# Patient Record
Sex: Female | Born: 1965
Health system: Southern US, Community
[De-identification: ages and names within clinical notes are randomized; demographics above are authoritative.]

## PROBLEM LIST (undated history)

## (undated) ENCOUNTER — Ambulatory Visit: Admission: EM | Source: Home / Self Care

## (undated) DIAGNOSIS — R739 Hyperglycemia, unspecified: Secondary | ICD-10-CM

## (undated) DIAGNOSIS — F32A Depression, unspecified: Secondary | ICD-10-CM

## (undated) DIAGNOSIS — D497 Neoplasm of unspecified behavior of endocrine glands and other parts of nervous system: Secondary | ICD-10-CM

## (undated) DIAGNOSIS — K589 Irritable bowel syndrome without diarrhea: Secondary | ICD-10-CM

## (undated) DIAGNOSIS — G2581 Restless legs syndrome: Secondary | ICD-10-CM

## (undated) DIAGNOSIS — R002 Palpitations: Secondary | ICD-10-CM

## (undated) DIAGNOSIS — F329 Major depressive disorder, single episode, unspecified: Secondary | ICD-10-CM

## (undated) DIAGNOSIS — F17200 Nicotine dependence, unspecified, uncomplicated: Secondary | ICD-10-CM

## (undated) DIAGNOSIS — E538 Deficiency of other specified B group vitamins: Secondary | ICD-10-CM

## (undated) DIAGNOSIS — N951 Menopausal and female climacteric states: Secondary | ICD-10-CM

## (undated) DIAGNOSIS — E785 Hyperlipidemia, unspecified: Secondary | ICD-10-CM

## (undated) DIAGNOSIS — F419 Anxiety disorder, unspecified: Secondary | ICD-10-CM

## (undated) DIAGNOSIS — T7840XA Allergy, unspecified, initial encounter: Secondary | ICD-10-CM

## (undated) DIAGNOSIS — M542 Cervicalgia: Secondary | ICD-10-CM

## (undated) DIAGNOSIS — E559 Vitamin D deficiency, unspecified: Secondary | ICD-10-CM

## (undated) DIAGNOSIS — E663 Overweight: Secondary | ICD-10-CM

## (undated) HISTORY — DX: Menopausal and female climacteric states: N95.1

## (undated) HISTORY — DX: Neoplasm of unspecified behavior of endocrine glands and other parts of nervous system: D49.7

## (undated) HISTORY — DX: Anxiety disorder, unspecified: F41.9

## (undated) HISTORY — DX: Palpitations: R00.2

## (undated) HISTORY — DX: Cervicalgia: M54.2

## (undated) HISTORY — DX: Major depressive disorder, single episode, unspecified: F32.9

## (undated) HISTORY — DX: Nicotine dependence, unspecified, uncomplicated: F17.200

## (undated) HISTORY — DX: Vitamin D deficiency, unspecified: E55.9

## (undated) HISTORY — DX: Depression, unspecified: F32.A

## (undated) HISTORY — DX: Hyperlipidemia, unspecified: E78.5

## (undated) HISTORY — DX: Hyperglycemia, unspecified: R73.9

## (undated) HISTORY — DX: Deficiency of other specified B group vitamins: E53.8

## (undated) HISTORY — DX: Irritable bowel syndrome, unspecified: K58.9

## (undated) HISTORY — PX: WISDOM TOOTH EXTRACTION: SHX21

## (undated) HISTORY — DX: Overweight: E66.3

## (undated) HISTORY — DX: Allergy, unspecified, initial encounter: T78.40XA

## (undated) HISTORY — PX: TUBAL LIGATION: SHX77

## (undated) HISTORY — PX: TONSILECTOMY, ADENOIDECTOMY, BILATERAL MYRINGOTOMY AND TUBES: SHX2538

## (undated) HISTORY — DX: Restless legs syndrome: G25.81

---

## 2006-08-24 ENCOUNTER — Ambulatory Visit (HOSPITAL_COMMUNITY): Admission: RE | Admit: 2006-08-24 | Discharge: 2006-08-24 | Payer: Self-pay | Admitting: Endocrinology

## 2009-03-14 ENCOUNTER — Ambulatory Visit (HOSPITAL_COMMUNITY): Admission: RE | Admit: 2009-03-14 | Discharge: 2009-03-14 | Payer: Self-pay | Admitting: Internal Medicine

## 2009-05-03 DIAGNOSIS — Z720 Tobacco use: Secondary | ICD-10-CM | POA: Insufficient documentation

## 2009-05-03 DIAGNOSIS — E559 Vitamin D deficiency, unspecified: Secondary | ICD-10-CM | POA: Insufficient documentation

## 2010-01-22 ENCOUNTER — Ambulatory Visit: Payer: Self-pay | Admitting: Family Medicine

## 2011-05-23 LAB — HM PAP SMEAR: HM PAP: NORMAL

## 2012-03-02 ENCOUNTER — Ambulatory Visit: Payer: Self-pay | Admitting: Family Medicine

## 2012-03-02 LAB — HM MAMMOGRAPHY: HM MAMMO: NORMAL

## 2012-11-22 ENCOUNTER — Ambulatory Visit: Payer: Self-pay | Admitting: Family Medicine

## 2013-04-13 ENCOUNTER — Encounter: Payer: Self-pay | Admitting: Podiatry

## 2013-04-13 ENCOUNTER — Ambulatory Visit (INDEPENDENT_AMBULATORY_CARE_PROVIDER_SITE_OTHER): Payer: Managed Care, Other (non HMO) | Admitting: Podiatry

## 2013-04-13 ENCOUNTER — Ambulatory Visit (INDEPENDENT_AMBULATORY_CARE_PROVIDER_SITE_OTHER): Payer: Managed Care, Other (non HMO)

## 2013-04-13 VITALS — BP 110/69 | HR 86 | Resp 16 | Ht 68.0 in | Wt 190.6 lb

## 2013-04-13 DIAGNOSIS — R52 Pain, unspecified: Secondary | ICD-10-CM

## 2013-04-13 DIAGNOSIS — S92919A Unspecified fracture of unspecified toe(s), initial encounter for closed fracture: Secondary | ICD-10-CM

## 2013-04-13 DIAGNOSIS — S92911A Unspecified fracture of right toe(s), initial encounter for closed fracture: Secondary | ICD-10-CM

## 2013-04-13 NOTE — Progress Notes (Signed)
This 47 year old white female presents today with a four-week history of a painful toe to her left foot. She states that she traumatized third digit of her left foot 4 weeks ago is now experiencing severe pain radiating out the third second and fourth toes. States it has remained swollen. She was seen by urgent care who suggested that she had no fracture to her third toe. Currently I have reviewed her past medical history her medications and allergies which are noted in the chart. Physical examination of the bilateral lower extremity reveals strongly palpable pulses bilaterally. Neurologic sensorium is intact per Semmes-Weinstein monofilament. Deep tendon reflexes are intact muscle strength is 5 over 5 dorsiflexion plantar flexors inverters and everters and all intrinsic musculature is intact. Dermatologic evaluation does not demonstrate any type of cutaneous abnormalities other than some mild swelling over the third metatarsophalangeal joint extending out into the third digit of the left foot. Radiographic evaluation does demonstrate a spiral oblique fracture to the proximal phalanx of the third digit of the left foot today mildly displaced about comminution.  Assessment: Fractured third digit left foot proximal phalanx  Plan: Surgical shoe x4 weeks. Wrapping the toe x4 weeks. Both oral and written home-going instructions were given for care of her fracture toe. I will followup with her in 4-6 weeks.

## 2013-04-13 NOTE — Progress Notes (Signed)
N puffy nerve pain shooting   L 3rd toe left  D69month O all of sudden hit bed post C little better  A all the time T ibuprofen , fast med urgent care xray

## 2013-05-02 ENCOUNTER — Ambulatory Visit: Payer: Self-pay | Admitting: Podiatry

## 2013-05-18 ENCOUNTER — Ambulatory Visit: Payer: Managed Care, Other (non HMO) | Admitting: Podiatry

## 2013-11-25 LAB — LIPID PANEL
Cholesterol: 221 mg/dL — AB (ref 0–200)
HDL: 47 mg/dL (ref 35–70)
LDL Cholesterol: 159 mg/dL
TRIGLYCERIDES: 77 mg/dL (ref 40–160)

## 2013-11-25 LAB — HEMOGLOBIN A1C: HEMOGLOBIN A1C: 5.5 % (ref 4.0–6.0)

## 2014-05-29 ENCOUNTER — Ambulatory Visit: Payer: Self-pay | Admitting: Gastroenterology

## 2014-05-29 LAB — HM COLONOSCOPY

## 2015-04-18 ENCOUNTER — Other Ambulatory Visit: Payer: Self-pay | Admitting: Family Medicine

## 2015-04-18 NOTE — Telephone Encounter (Signed)
Patient requesting refill. 

## 2015-04-19 NOTE — Telephone Encounter (Signed)
Pt states she will call back to schedule an appt.   

## 2015-04-23 NOTE — Telephone Encounter (Signed)
Left voice message to sch appointment °

## 2015-05-20 ENCOUNTER — Other Ambulatory Visit: Payer: Self-pay | Admitting: Family Medicine

## 2015-05-21 NOTE — Telephone Encounter (Signed)
Appointment made for 05-31-15

## 2015-05-31 ENCOUNTER — Encounter: Payer: Self-pay | Admitting: Family Medicine

## 2015-05-31 ENCOUNTER — Ambulatory Visit (INDEPENDENT_AMBULATORY_CARE_PROVIDER_SITE_OTHER): Payer: BLUE CROSS/BLUE SHIELD | Admitting: Family Medicine

## 2015-05-31 VITALS — BP 116/68 | HR 68 | Temp 98.4°F | Resp 16 | Ht 68.0 in | Wt 190.5 lb

## 2015-05-31 DIAGNOSIS — R739 Hyperglycemia, unspecified: Secondary | ICD-10-CM | POA: Insufficient documentation

## 2015-05-31 DIAGNOSIS — F411 Generalized anxiety disorder: Secondary | ICD-10-CM | POA: Diagnosis not present

## 2015-05-31 DIAGNOSIS — G2581 Restless legs syndrome: Secondary | ICD-10-CM | POA: Insufficient documentation

## 2015-05-31 DIAGNOSIS — N951 Menopausal and female climacteric states: Secondary | ICD-10-CM | POA: Diagnosis not present

## 2015-05-31 DIAGNOSIS — D352 Benign neoplasm of pituitary gland: Secondary | ICD-10-CM | POA: Insufficient documentation

## 2015-05-31 DIAGNOSIS — Z23 Encounter for immunization: Secondary | ICD-10-CM

## 2015-05-31 DIAGNOSIS — E538 Deficiency of other specified B group vitamins: Secondary | ICD-10-CM | POA: Insufficient documentation

## 2015-05-31 DIAGNOSIS — F330 Major depressive disorder, recurrent, mild: Secondary | ICD-10-CM | POA: Insufficient documentation

## 2015-05-31 DIAGNOSIS — J309 Allergic rhinitis, unspecified: Secondary | ICD-10-CM | POA: Insufficient documentation

## 2015-05-31 DIAGNOSIS — E785 Hyperlipidemia, unspecified: Secondary | ICD-10-CM | POA: Insufficient documentation

## 2015-05-31 DIAGNOSIS — M7710 Lateral epicondylitis, unspecified elbow: Secondary | ICD-10-CM | POA: Insufficient documentation

## 2015-05-31 DIAGNOSIS — M7711 Lateral epicondylitis, right elbow: Secondary | ICD-10-CM

## 2015-05-31 DIAGNOSIS — K580 Irritable bowel syndrome with diarrhea: Secondary | ICD-10-CM | POA: Insufficient documentation

## 2015-05-31 MED ORDER — ALPRAZOLAM 0.5 MG PO TABS: 0.5000 mg | ORAL_TABLET | Freq: Two times a day (BID) | ORAL | Status: DC | PRN

## 2015-05-31 MED ORDER — ESCITALOPRAM OXALATE 20 MG PO TABS: 20.0000 mg | ORAL_TABLET | Freq: Every day | ORAL | Status: DC

## 2015-05-31 MED ORDER — ALPRAZOLAM ER 1 MG PO TB24: 1.0000 mg | ORAL_TABLET | Freq: Every day | ORAL | Status: DC

## 2015-05-31 MED ORDER — TENNIS ELBOW NEOPRENE BRACE MISC: 1.0000 [IU] | Freq: Every day | Status: DC

## 2015-05-31 NOTE — Progress Notes (Signed)
Name: Cynthia Bean   MRN: [id number]    DOB: December 18, 1965   Date:05/31/2015       Progress Note  Subjective  Chief Complaint  Chief Complaint  Patient presents with  . Medication Refill    follow-up  . Hyperlipidemia  . Depression  . Anxiety  . Labs Only    lab paperwork for insurance    HPI  GAD: she states her mind is always going, feels nervous, taking Alprazolam 0.5 mg twice daily to help with symptoms. Taking Lexapro also, denies side effects of medication  Major Depression: she has been doing well on Lexapro, but feels angry, snappy, short tempered worse during her cycles. No crying spells.   Hyperlipidemia: taking Mevacor every other day and denies side effects, no chest pain  Right lateral epicondylitis: she states that for the past 6 months she has noticed pain, swelling, worse with pronation and lifting things. 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   Perimenopause: she has been having irregular cycles, and lasts longer with more mood changes over the past couple of months. She has a history of pituitary adenoma, but does not want to have labs or repeat MRI at this time  Patient Active Problem List   Diagnosis Date Noted  . Fracture, toe 04/13/2013    Past Surgical History  Procedure Laterality Date  . Tubal ligation    . Tonsilectomy, adenoidectomy, bilateral myringotomy and tubes      Family History  Problem Relation Age of Onset  . Hyperlipidemia Mother   . Hypertension Mother   . Thyroid disease Mother   . Diabetes Father   . Hypertension Father   . CAD Father   . ADD / ADHD Son     Social History   Social History  . Marital Status: Married    Spouse Name: N/A  . Number of Children: N/A  . Years of Education: N/A   Occupational History  . Not on file.   Social History Main Topics  . Smoking status: Current Every Day Smoker -- 0.50 packs/day for 30 years    Types: Cigarettes  . Smokeless tobacco: Never Used  .  Alcohol Use: Yes     Comment: occasional  . Drug Use: No  . Sexual Activity: Not on file   Other Topics Concern  . Not on file   Social History Narrative     Current outpatient prescriptions:  .  Cyanocobalamin (B-12) 1000 MCG SUBL, Place 1 tablet under the tongue daily., Disp: , Rfl:  .  ALPRAZolam (XANAX) 0.5 MG tablet, daily as needed., Disp: , Rfl:  .  Calcium-Magnesium-Vitamin D (CALCIUM 1200+D3 PO), Take 1 tablet by mouth daily., Disp: , Rfl:  .  escitalopram (LEXAPRO) 20 MG tablet, Take by mouth daily., Disp: , Rfl:  .  ibuprofen (ADVIL,MOTRIN) 800 MG tablet, Take 800 mg by mouth once., Disp: , Rfl:  .  lovastatin (MEVACOR) 20 MG tablet, TAKE 1 TABLET BY MOUTH EVERY DAY FOR CHOLESTEROL, Disp: 90 tablet, Rfl: 0  Allergies  Allergen Reactions  . Biaxin  [Clarithromycin] Other (See Comments)  . Darvocet [Propoxyphene N-Acetaminophen] Nausea Only     ROS  Constitutional: Negative for fever mild weight change.  Respiratory: Negative for cough and shortness of breath.   Cardiovascular: Negative for chest pain or palpitations.  Gastrointestinal: Negative for abdominal pain, no bowel changes - IBS.  Musculoskeletal: Negative for gait problem or joint swelling.  Skin: Negative for rash.  Neurological: Negative for dizziness or headache.  No other specific complaints in a complete review of systems (except as listed in HPI above).  Objective  Filed Vitals:   05/31/15 1134  BP: 116/68  Pulse: 68  Temp: 98.4 F (36.9 C)  TempSrc: Oral  Resp: 16  Height: 5\' 8"  (1.727 m)  Weight: 190 lb 8 oz (86.41 kg)  SpO2: 98%    Body mass index is 28.97 kg/(m^2).  Physical Exam  Constitutional: Patient appears well-developed and well-nourished. Obese  No distress.  HEENT: head atraumatic, normocephalic, pupils equal and reactive to light,  neck supple, throat within normal limits Cardiovascular: Normal rate, regular rhythm and normal heart sounds.  No murmur heard. No BLE  edema. Pulmonary/Chest: Effort normal and breath sounds normal. No respiratory distress. Abdominal: Soft.  There is no tenderness. Psychiatric: Patient has a normal mood and affect. behavior is normal. Judgment and thought content normal.   PHQ2/9: Depression screen PHQ 2/9 05/31/2015  Decreased Interest 0  Down, Depressed, Hopeless 0  PHQ - 2 Score 0    Fall Risk: Fall Risk  05/31/2015  Falls in the past year? No    Functional Status Survey: Is the patient deaf or have difficulty hearing?: No Does the patient have difficulty seeing, even when wearing glasses/contacts?: Yes (glasses) Does the patient have difficulty concentrating, remembering, or making decisions?: No Does the patient have difficulty walking or climbing stairs?: No Does the patient have difficulty dressing or bathing?: No Does the patient have difficulty doing errands alone such as visiting a doctor's office or shopping?: No    Assessment & Plan  1. Epicondylitis, lateral (tennis elbow), right  - Elastic Bandages & Supports (TENNIS ELBOW NEOPRENE BRACE) MISC; 1 Units by Does not apply route daily.  Dispense: 1 each; Refill: 0  2. Needs flu shot  - Flu Vaccine QUAD 36+ mos PF IM (Fluarix & Fluzone Quad PF)  3. Blood glucose elevated  - Glucose  4. Dyslipidemia  - Lipid panel  5. Female climacteric state  Continue lexapro  6. Generalized anxiety disorder  She will try Alprazolam XR - pay cash and if she likes she will call us back - escitalopram (LEXAPRO) 20 MG tablet; Take 1 tablet (20 mg total) by mouth daily.  Dispense: 90 tablet; Refill: 0 - ALPRAZolam (XANAX XR) 1 MG 24 hr tablet; Take 1 tablet (1 mg total) by mouth daily.  Dispense: 2 tablet; Refill: 0 - ALPRAZolam (XANAX) 0.5 MG tablet; Take 1 tablet (0.5 mg total) by mouth 2 (two) times daily as needed.  Dispense: 60 tablet; Refill: 0  7. Depression, major, recurrent, mild (HCC)  - escitalopram (LEXAPRO) 20 MG tablet; Take 1 tablet (20  mg total) by mouth daily.  Dispense: 90 tablet; Refill: 0

## 2015-07-01 ENCOUNTER — Other Ambulatory Visit: Payer: Self-pay | Admitting: Family Medicine

## 2015-07-04 ENCOUNTER — Other Ambulatory Visit: Payer: Self-pay | Admitting: Family Medicine

## 2015-07-04 NOTE — Telephone Encounter (Signed)
Patient requesting refill. 

## 2015-07-05 ENCOUNTER — Telehealth: Payer: Self-pay | Admitting: Family Medicine

## 2015-07-05 NOTE — Telephone Encounter (Signed)
Pt needs refill on Alprazolam. Pt thinks she should have a 3 mth supply but the last refill she got was only 30 day supply. Pt would like a call back as to why its only 30 days.

## 2015-07-05 NOTE — Telephone Encounter (Signed)
Spoke with patient and informed her she received a supply of 60 Alprazolam and was instructed to take as needed. But to call back when she is low.

## 2015-08-22 ENCOUNTER — Other Ambulatory Visit: Payer: Self-pay | Admitting: Family Medicine

## 2015-08-23 NOTE — Telephone Encounter (Signed)
Patient requesting refill. 

## 2015-09-20 ENCOUNTER — Encounter: Payer: Self-pay | Admitting: Family Medicine

## 2015-09-20 ENCOUNTER — Ambulatory Visit (INDEPENDENT_AMBULATORY_CARE_PROVIDER_SITE_OTHER): Payer: BLUE CROSS/BLUE SHIELD | Admitting: Family Medicine

## 2015-09-20 VITALS — BP 112/58 | HR 82 | Temp 98.5°F | Resp 14 | Wt 191.8 lb

## 2015-09-20 DIAGNOSIS — Z1239 Encounter for other screening for malignant neoplasm of breast: Secondary | ICD-10-CM

## 2015-09-20 DIAGNOSIS — E538 Deficiency of other specified B group vitamins: Secondary | ICD-10-CM

## 2015-09-20 DIAGNOSIS — R739 Hyperglycemia, unspecified: Secondary | ICD-10-CM | POA: Diagnosis not present

## 2015-09-20 DIAGNOSIS — F330 Major depressive disorder, recurrent, mild: Secondary | ICD-10-CM

## 2015-09-20 DIAGNOSIS — F411 Generalized anxiety disorder: Secondary | ICD-10-CM | POA: Diagnosis not present

## 2015-09-20 DIAGNOSIS — D352 Benign neoplasm of pituitary gland: Secondary | ICD-10-CM

## 2015-09-20 DIAGNOSIS — E559 Vitamin D deficiency, unspecified: Secondary | ICD-10-CM

## 2015-09-20 DIAGNOSIS — Z79899 Other long term (current) drug therapy: Secondary | ICD-10-CM | POA: Diagnosis not present

## 2015-09-20 DIAGNOSIS — E785 Hyperlipidemia, unspecified: Secondary | ICD-10-CM | POA: Diagnosis not present

## 2015-09-20 DIAGNOSIS — N921 Excessive and frequent menstruation with irregular cycle: Secondary | ICD-10-CM | POA: Diagnosis not present

## 2015-09-20 MED ORDER — ESCITALOPRAM OXALATE 20 MG PO TABS: 20.0000 mg | ORAL_TABLET | Freq: Every day | ORAL | Status: DC

## 2015-09-20 MED ORDER — LOVASTATIN 20 MG PO TABS: ORAL_TABLET | ORAL | Status: DC

## 2015-09-20 MED ORDER — ALPRAZOLAM 0.5 MG PO TABS: 0.5000 mg | ORAL_TABLET | Freq: Two times a day (BID) | ORAL | Status: DC | PRN

## 2015-09-20 NOTE — Progress Notes (Signed)
Name: Cynthia Bean   MRN: [id number]    DOB: 1965-09-30   Date:09/20/2015       Progress Note  Subjective  Chief Complaint  Chief Complaint  Patient presents with  . Medication Refill    lexapro and xanax    HPI  GAD: she states her mind is always going, feels nervous, taking Alprazolam 0.5 mg at most  twice daily to help with symptoms, she tried Alprazolam XR 1 mg but she did not feel comfortable taking it.  Taking Lexapro also, denies side effects of medication.   Major Depression: she has been doing well on Lexapro, but feels angry, snappy, short tempered worse during her cycles. No crying spells. She also noticed some problems concentrating at work lately.   Hyperlipidemia: taking Mevacor every other day and denies side effects, no chest pain  Right lateral epicondylitis: she states that for the past 9 months she has noticed pain, swelling, worse with pronation and lifting things. 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 She states she is doing much better now, almost resolved after she used a leaf blower.   Perimenopause: she has been having irregular cycles and over the past 4 months cycles have been heavier  and twice monthly, sometimes lasting more than one week,  with a lot of PMS symptoms. She has a history of pituitary adenoma, and she is willing to have MRI done at this time.  Hyperglycemia: she denies polyphagia, polydipsia or polyuria  Prolactinoma : diagnosed in 2008 at [facility], she was seeing Oncologist Mamie Levers ) at [facility], took medication for one year - Dostinex , but stopped on her own. She has been refusing repeat MRI for years. She denies galactorrhea.  Patient Active Problem List   Diagnosis Date Noted  . Menorrhagia with irregular cycle 09/20/2015  . Allergic rhinitis 05/31/2015  . Depression, major, recurrent, mild ([place]) 05/31/2015  . Dyslipidemia 05/31/2015  . Blood glucose elevated 05/31/2015  . Irritable bowel syndrome  with diarrhea 05/31/2015  . Female climacteric state 05/31/2015  . Pituitary adenoma ([place]) 05/31/2015  . Restless legs syndrome 05/31/2015  . B12 deficiency 05/31/2015  . Epicondylitis, lateral (tennis elbow) 05/31/2015  . Fracture, toe 04/13/2013  . Tobacco use 05/03/2009  . Vitamin D deficiency 05/03/2009    Past Surgical History  Procedure Laterality Date  . Tubal ligation    . Tonsilectomy, adenoidectomy, bilateral myringotomy and tubes      Family History  Problem Relation Age of Onset  . Hyperlipidemia Mother   . Hypertension Mother   . Thyroid disease Mother   . Diabetes Father   . Hypertension Father   . CAD Father   . ADD / ADHD Son     Social History   Social History  . Marital Status: Married    Spouse Name: N/A  . Number of Children: N/A  . Years of Education: N/A   Occupational History  . Not on file.   Social History Main Topics  . Smoking status: Current Every Day Smoker -- 0.50 packs/day for 30 years    Types: Cigarettes  . Smokeless tobacco: Never Used  . Alcohol Use: Yes     Comment: occasional  . Drug Use: No  . Sexual Activity: Not on file   Other Topics Concern  . Not on file   Social History Narrative     Current outpatient prescriptions:  .  ALPRAZolam (XANAX) 0.5 MG tablet, Take 1  tablet (0.5 mg total) by mouth 2 (two) times daily as needed., Disp: 35 tablet, Rfl: 2 .  Calcium-Magnesium-Vitamin D (CALCIUM 1200+D3 PO), Take 1 tablet by mouth daily., Disp: , Rfl:  .  Cyanocobalamin (B-12) 1000 MCG SUBL, Place 1 tablet under the tongue daily., Disp: , Rfl:  .  Elastic Bandages & Supports (TENNIS ELBOW NEOPRENE BRACE) MISC, 1 Units by Does not apply route daily., Disp: 1 each, Rfl: 0 .  escitalopram (LEXAPRO) 20 MG tablet, Take 1 tablet (20 mg total) by mouth daily., Disp: 90 tablet, Rfl: 1 .  ibuprofen (ADVIL,MOTRIN) 800 MG tablet, Take 800 mg by mouth once., Disp: , Rfl:  .  lovastatin (MEVACOR) 20 MG tablet, TAKE 1 TABLET BY MOUTH  EVERY DAY FOR CHOLESTEROL, Disp: 90 tablet, Rfl: 1  Allergies  Allergen Reactions  . Bupropion Hcl Diarrhea  . Biaxin  [Clarithromycin] Other (See Comments)  . Darvocet [Propoxyphene N-Acetaminophen] Nausea Only     ROS  Constitutional: Negative for fever or weight change.  Respiratory: Negative for cough and shortness of breath.   Cardiovascular: Negative for chest pain or palpitations.  Gastrointestinal: Negative for abdominal pain, no bowel changes.  Musculoskeletal: Negative for gait problem or joint swelling.  Skin: Negative for rash.  Neurological: Negative for dizziness or headache.  No other specific complaints in a complete review of systems (except as listed in HPI above).  Objective  Filed Vitals:   09/20/15 1153  BP: 112/58  Pulse: 82  Temp: 98.5 F (36.9 C)  TempSrc: Oral  Resp: 14  Weight: 191 lb 12.8 oz (87 kg)  SpO2: 93%    Body mass index is 29.17 kg/(m^2).  Physical Exam  Constitutional: Patient appears well-developed and well-nourished. Obese  No distress.  HEENT: head atraumatic, normocephalic, pupils equal and reactive to light,  neck supple, throat within normal limits Cardiovascular: Normal rate, regular rhythm and normal heart sounds.  No murmur heard. No BLE edema. Pulmonary/Chest: Effort normal and breath sounds normal. No respiratory distress. Abdominal: Soft.  There is no tenderness. Psychiatric: Patient has a normal mood and affect. behavior is normal. Judgment and thought content normal.  PHQ2/9: Depression screen [facility] 2/9 09/20/2015 05/31/2015  Decreased Interest 0 0  Down, Depressed, Hopeless 0 0  PHQ - 2 Score 0 0     Fall Risk: Fall Risk  09/20/2015 05/31/2015  Falls in the past year? No No     Functional Status Survey: Is the patient deaf or have difficulty hearing?: No Does the patient have difficulty seeing, even when wearing glasses/contacts?: No Does the patient have difficulty concentrating, remembering, or making  decisions?: No Does the patient have difficulty walking or climbing stairs?: No Does the patient have difficulty dressing or bathing?: No Does the patient have difficulty doing errands alone such as visiting a doctor's office or shopping?: No   Assessment & Plan  1. Menorrhagia with irregular cycle  - TSH - Prolactin -refer to Gyn  2. Vitamin D deficiency  - VITAMIN D 25 Hydroxy (Vit-D Deficiency, Fractures)  3. Prolactinoma  ([place])  - Prolactin -MRI pituitary -refer to [facility]  4. B12 deficiency  - Vitamin B12  5. Depression, major, recurrent, mild (HCC)  - escitalopram (LEXAPRO) 20 MG tablet; Take 1 tablet (20 mg total) by mouth daily.  Dispense: 90 tablet; Refill: 1  6. Dyslipidemia  - Lipid panel - lovastatin (MEVACOR) 20 MG tablet; TAKE 1 TABLET BY MOUTH EVERY DAY FOR CHOLESTEROL  Dispense: 90 tablet; Refill: 1  7.  Blood glucose elevated  - Hemoglobin A1c  8. Generalized anxiety disorder  - CBC with Differential/Platelet - escitalopram (LEXAPRO) 20 MG tablet; Take 1 tablet (20 mg total) by mouth daily.  Dispense: 90 tablet; Refill: 1 - ALPRAZolam (XANAX) 0.5 MG tablet; Take 1 tablet (0.5 mg total) by mouth 2 (two) times daily as needed.  Dispense: 35 tablet; Refill: 2  9. Long-term use of high-risk medication  - Comprehensive metabolic panel  10. Breast cancer screening  - MM Digital Screening; Future

## 2015-09-25 LAB — COMPREHENSIVE METABOLIC PANEL
ALT: 10 IU/L (ref 0–32)
AST: 8 IU/L (ref 0–40)
Albumin/Globulin Ratio: 1.6 (ref 1.2–2.2)
Albumin: 3.6 g/dL (ref 3.5–5.5)
Alkaline Phosphatase: 80 IU/L (ref 39–117)
BUN/Creatinine Ratio: 9 (ref 9–23)
BUN: 7 mg/dL (ref 6–24)
Bilirubin Total: 0.3 mg/dL (ref 0.0–1.2)
CALCIUM: 8.9 mg/dL (ref 8.7–10.2)
CO2: 23 mmol/L (ref 18–29)
CREATININE: 0.8 mg/dL (ref 0.57–1.00)
Chloride: 102 mmol/L (ref 96–106)
GFR calc Af Amer: 99 mL/min/{1.73_m2} (ref >59)
GFR, EST NON AFRICAN AMERICAN: 86 mL/min/{1.73_m2} (ref >59)
GLOBULIN, TOTAL: 2.3 g/dL (ref 1.5–4.5)
Glucose: 86 mg/dL (ref 65–99)
Potassium: 4.5 mmol/L (ref 3.5–5.2)
Sodium: 139 mmol/L (ref 134–144)
Total Protein: 5.9 g/dL — ABNORMAL LOW (ref 6.0–8.5)

## 2015-09-25 LAB — LIPID PANEL
CHOL/HDL RATIO: 4.5 ratio — AB (ref 0.0–4.4)
CHOLESTEROL TOTAL: 203 mg/dL — AB (ref 100–199)
HDL: 45 mg/dL (ref >39)
LDL CALC: 136 mg/dL — AB (ref 0–99)
Triglycerides: 111 mg/dL (ref 0–149)
VLDL Cholesterol Cal: 22 mg/dL (ref 5–40)

## 2015-09-25 LAB — CBC WITH DIFFERENTIAL/PLATELET
BASOS: 0 %
Basophils Absolute: 0 10*3/uL (ref 0.0–0.2)
EOS (ABSOLUTE): 0.1 10*3/uL (ref 0.0–0.4)
EOS: 1 %
HEMATOCRIT: 37 % (ref 34.0–46.6)
Hemoglobin: 12.5 g/dL (ref 11.1–15.9)
IMMATURE GRANULOCYTES: 0 %
Immature Grans (Abs): 0 10*3/uL (ref 0.0–0.1)
LYMPHS: 19 %
Lymphocytes Absolute: 1.4 10*3/uL (ref 0.7–3.1)
MCH: 32.8 pg (ref 26.6–33.0)
MCHC: 33.8 g/dL (ref 31.5–35.7)
MCV: 97 fL (ref 79–97)
Monocytes Absolute: 0.7 10*3/uL (ref 0.1–0.9)
Monocytes: 9 %
NEUTROS PCT: 71 %
Neutrophils Absolute: 5.1 10*3/uL (ref 1.4–7.0)
PLATELETS: 302 10*3/uL (ref 150–379)
RBC: 3.81 x10E6/uL (ref 3.77–5.28)
RDW: 13.6 % (ref 12.3–15.4)
WBC: 7.2 10*3/uL (ref 3.4–10.8)

## 2015-09-25 LAB — HEMOGLOBIN A1C
ESTIMATED AVERAGE GLUCOSE: 105 mg/dL
HEMOGLOBIN A1C: 5.3 % (ref 4.8–5.6)

## 2015-09-25 LAB — VITAMIN B12: VITAMIN B 12: 1685 pg/mL — AB (ref 211–946)

## 2015-09-25 LAB — PROLACTIN: Prolactin: 23.8 ng/mL — ABNORMAL HIGH (ref 4.8–23.3)

## 2015-09-25 LAB — TSH: TSH: 2.05 u[IU]/mL (ref 0.450–4.500)

## 2015-10-02 ENCOUNTER — Ambulatory Visit
Admission: RE | Admit: 2015-10-02 | Discharge: 2015-10-02 | Disposition: A | Discharge status: Home / Self Care | Payer: BLUE CROSS/BLUE SHIELD | Source: Ambulatory Visit | Attending: Family Medicine | Admitting: Family Medicine

## 2015-10-02 ENCOUNTER — Other Ambulatory Visit: Payer: Self-pay | Admitting: Family Medicine

## 2015-10-02 DIAGNOSIS — Z1231 Encounter for screening mammogram for malignant neoplasm of breast: Secondary | ICD-10-CM

## 2015-10-02 DIAGNOSIS — Z1239 Encounter for other screening for malignant neoplasm of breast: Secondary | ICD-10-CM

## 2015-10-03 ENCOUNTER — Encounter: Payer: Self-pay | Admitting: Family Medicine

## 2015-10-04 ENCOUNTER — Other Ambulatory Visit: Payer: Self-pay | Admitting: Family Medicine

## 2015-10-04 DIAGNOSIS — Z0000 Encounter for general adult medical examination without abnormal findings: Secondary | ICD-10-CM

## 2015-10-04 DIAGNOSIS — D352 Benign neoplasm of pituitary gland: Secondary | ICD-10-CM

## 2015-10-12 ENCOUNTER — Ambulatory Visit
Admission: RE | Admit: 2015-10-12 | Discharge: 2015-10-12 | Disposition: A | Discharge status: Home / Self Care | Payer: BLUE CROSS/BLUE SHIELD | Source: Ambulatory Visit | Attending: Family Medicine | Admitting: Family Medicine

## 2015-10-12 DIAGNOSIS — D352 Benign neoplasm of pituitary gland: Secondary | ICD-10-CM | POA: Diagnosis not present

## 2015-10-12 MED ORDER — GADOBENATE DIMEGLUMINE 529 MG/ML IV SOLN
10.0000 mL | Freq: Once | INTRAVENOUS | Status: AC | PRN
  Administered 2015-10-12: 10 mL via INTRAVENOUS

## 2015-10-16 DIAGNOSIS — N939 Abnormal uterine and vaginal bleeding, unspecified: Secondary | ICD-10-CM | POA: Diagnosis not present

## 2015-10-16 DIAGNOSIS — Z01419 Encounter for gynecological examination (general) (routine) without abnormal findings: Secondary | ICD-10-CM | POA: Diagnosis not present

## 2015-10-16 DIAGNOSIS — Z124 Encounter for screening for malignant neoplasm of cervix: Secondary | ICD-10-CM | POA: Diagnosis not present

## 2015-11-05 DIAGNOSIS — D251 Intramural leiomyoma of uterus: Secondary | ICD-10-CM | POA: Diagnosis not present

## 2015-11-05 DIAGNOSIS — F3281 Premenstrual dysphoric disorder: Secondary | ICD-10-CM | POA: Diagnosis not present

## 2015-11-05 DIAGNOSIS — N939 Abnormal uterine and vaginal bleeding, unspecified: Secondary | ICD-10-CM | POA: Diagnosis not present

## 2015-11-05 DIAGNOSIS — E221 Hyperprolactinemia: Secondary | ICD-10-CM | POA: Diagnosis not present

## 2015-11-14 ENCOUNTER — Encounter: Payer: BLUE CROSS/BLUE SHIELD | Admitting: Obstetrics and Gynecology

## 2015-11-26 ENCOUNTER — Encounter: Payer: Self-pay | Admitting: Family Medicine

## 2015-11-28 ENCOUNTER — Encounter: Payer: Self-pay | Admitting: Family Medicine

## 2015-12-24 ENCOUNTER — Encounter: Payer: Self-pay | Admitting: Family Medicine

## 2015-12-28 ENCOUNTER — Other Ambulatory Visit: Payer: Self-pay | Admitting: Family Medicine

## 2015-12-28 NOTE — Telephone Encounter (Signed)
Patient requesting refill. 

## 2016-01-01 ENCOUNTER — Encounter: Payer: Self-pay | Admitting: Family Medicine

## 2016-01-03 ENCOUNTER — Other Ambulatory Visit: Payer: Self-pay | Admitting: Family Medicine

## 2016-01-03 DIAGNOSIS — F411 Generalized anxiety disorder: Secondary | ICD-10-CM

## 2016-01-03 MED ORDER — ALPRAZOLAM 0.5 MG PO TABS: 0.5000 mg | ORAL_TABLET | Freq: Two times a day (BID) | ORAL | Status: DC | PRN

## 2016-01-03 NOTE — Telephone Encounter (Signed)
Refill request was sent to Dr. Krichna Sowles for approval and submission.  

## 2016-01-03 NOTE — Telephone Encounter (Signed)
Patient scheduled appointment for 01-28-16, (tried to get her in today but she had to pick up her grandson). She only have a couple of pills left of alprazolam and is asking for enough to last until her appointment. Please send to [facility]. She would also like to thank you for all you do for her mom.

## 2016-01-10 ENCOUNTER — Encounter: Payer: Self-pay | Admitting: Family Medicine

## 2016-01-30 ENCOUNTER — Ambulatory Visit: Payer: BLUE CROSS/BLUE SHIELD | Admitting: Family Medicine

## 2016-02-27 ENCOUNTER — Encounter: Payer: Self-pay | Admitting: Family Medicine

## 2016-02-27 ENCOUNTER — Ambulatory Visit (INDEPENDENT_AMBULATORY_CARE_PROVIDER_SITE_OTHER): Payer: BLUE CROSS/BLUE SHIELD | Admitting: Family Medicine

## 2016-02-27 VITALS — BP 122/68 | HR 82 | Temp 98.6°F | Resp 16 | Ht 68.0 in | Wt 194.5 lb

## 2016-02-27 DIAGNOSIS — D259 Leiomyoma of uterus, unspecified: Secondary | ICD-10-CM

## 2016-02-27 DIAGNOSIS — D352 Benign neoplasm of pituitary gland: Secondary | ICD-10-CM

## 2016-02-27 DIAGNOSIS — E559 Vitamin D deficiency, unspecified: Secondary | ICD-10-CM

## 2016-02-27 DIAGNOSIS — E785 Hyperlipidemia, unspecified: Secondary | ICD-10-CM

## 2016-02-27 DIAGNOSIS — F411 Generalized anxiety disorder: Secondary | ICD-10-CM | POA: Diagnosis not present

## 2016-02-27 DIAGNOSIS — F330 Major depressive disorder, recurrent, mild: Secondary | ICD-10-CM

## 2016-02-27 DIAGNOSIS — E538 Deficiency of other specified B group vitamins: Secondary | ICD-10-CM | POA: Diagnosis not present

## 2016-02-27 DIAGNOSIS — R21 Rash and other nonspecific skin eruption: Secondary | ICD-10-CM | POA: Diagnosis not present

## 2016-02-27 DIAGNOSIS — N939 Abnormal uterine and vaginal bleeding, unspecified: Secondary | ICD-10-CM | POA: Insufficient documentation

## 2016-02-27 MED ORDER — TRIAMCINOLONE ACETONIDE 0.1 % EX CREA: 1.0000 "application " | TOPICAL_CREAM | Freq: Two times a day (BID) | CUTANEOUS | 0 refills | Status: DC

## 2016-02-27 MED ORDER — ALPRAZOLAM 0.5 MG PO TABS: 0.5000 mg | ORAL_TABLET | Freq: Two times a day (BID) | ORAL | 2 refills | Status: DC | PRN

## 2016-02-27 MED ORDER — ESCITALOPRAM OXALATE 20 MG PO TABS: 20.0000 mg | ORAL_TABLET | Freq: Every day | ORAL | 1 refills | Status: DC

## 2016-02-27 NOTE — Progress Notes (Signed)
Name: Cynthia Bean   MRN: [id number]    DOB: 07-30-1965   Date:02/27/2016       Progress Note  Subjective  Chief Complaint  Chief Complaint  Patient presents with  . Medication Refill  . Depression    Stable with medication  . Anxiety  . Hyperlipidemia    Quit taking medication due to blood work showing normal ranges    HPI  GAD: she states her mind is always going, feels nervous, taking Alprazolam 0.5 mg at most  once daily to help with symptoms, she tried Alprazolam XR 1 mg but she did not feel comfortable taking it.  Taking Lexapro also, denies side effects of medication.   Major Depression: she has been doing well on Lexapro, but feels angry, snappy, short tempered worse during her cycles. No crying spells. She is late on her cycle, 3 weeks and states her mood is very good now.   Hyperlipidemia: taking Mevacor every other day and denies side effects, no chest pain  Perimenopause: seen by GYN, had a normal pap, diagnosed with fibroid tumors. Cycles when present a little heavier and painful.   Hyperglycemia: she denies polyphagia, polydipsia or polyuria  Prolactinoma : diagnosed in 2008 at [facility], she was seeing Oncologist Mamie Levers ) at [facility], took medication for one year - Dostinex , but stopped on her own. She has been refusing repeat MRI for years. She denies galactorrhea. Last Prolactin was still elevated - and advised to go back in March, but she cancelled the appointment   Rash: on anterior chest, recurrent ( this time of the year ) past couple of years. She has used topical anti-fungal and hydrocortisone, she will call [facility] to get an appointment  Patient Active Problem List   Diagnosis Date Noted  . Abnormal uterine bleeding (AUB) 02/27/2016  . Uterine fibroid 02/27/2016  . Generalized anxiety disorder 09/20/2015  . Allergic rhinitis 05/31/2015  . Depression, major, recurrent, mild ([place]) 05/31/2015  . Dyslipidemia 05/31/2015  .  Blood glucose elevated 05/31/2015  . Irritable bowel syndrome with diarrhea 05/31/2015  . Female climacteric state 05/31/2015  . Pituitary adenoma ([place]) 05/31/2015  . Restless legs syndrome 05/31/2015  . B12 deficiency 05/31/2015  . Epicondylitis, lateral (tennis elbow) 05/31/2015  . Fracture, toe 04/13/2013  . Tobacco use 05/03/2009  . Vitamin D deficiency 05/03/2009    Past Surgical History:  Procedure Laterality Date  . TONSILECTOMY, ADENOIDECTOMY, BILATERAL MYRINGOTOMY AND TUBES    . TUBAL LIGATION      Family History  Problem Relation Age of Onset  . Hyperlipidemia Mother   . Hypertension Mother   . Thyroid disease Mother   . Diabetes Father   . Hypertension Father   . CAD Father   . ADD / ADHD Son   . Breast cancer Maternal Grandmother     Social History   Social History  . Marital status: Married    Spouse name: N/A  . Number of children: N/A  . Years of education: N/A   Occupational History  . Not on file.   Social History Main Topics  . Smoking status: Current Every Day Smoker    Packs/day: 0.50    Years: 30.00    Types: Cigarettes  . Smokeless tobacco: Never Used  . Alcohol use Yes     Comment: occasional  . Drug use: No  . Sexual activity: Yes    Partners: Male   Other Topics Concern  . Not on file  Social History Narrative  . No narrative on file     Current Outpatient Prescriptions:  .  ALPRAZolam (XANAX) 0.5 MG tablet, Take 1 tablet (0.5 mg total) by mouth 2 (two) times daily as needed., Disp: 30 tablet, Rfl: 2 .  Cyanocobalamin (B-12) 1000 MCG SUBL, Place 1 tablet under the tongue every other day. , Disp: , Rfl:  .  escitalopram (LEXAPRO) 20 MG tablet, Take 1 tablet (20 mg total) by mouth daily., Disp: 90 tablet, Rfl: 1 .  triamcinolone cream (KENALOG) 0.1 %, Apply 1 application topically 2 (two) times daily., Disp: 30 g, Rfl: 0  Allergies  Allergen Reactions  . Biaxin  [Clarithromycin] Other (See Comments)  . Bupropion Hcl  Diarrhea  . Darvocet [Propoxyphene N-Acetaminophen] Nausea Only     ROS  Constitutional: Negative for fever or weight change.  Respiratory: Negative for cough and shortness of breath.   Cardiovascular: Negative for chest pain or palpitations.  Gastrointestinal: Negative for abdominal pain, no bowel changes.  Musculoskeletal: Negative for gait problem or joint swelling.  Skin: Negative for rash.  Neurological: Negative for dizziness or headache.  No other specific complaints in a complete review of systems (except as listed in HPI above).  Objective  Vitals:   02/27/16 1202  BP: 122/68  Pulse: 82  Resp: 16  Temp: 98.6 F (37 C)  TempSrc: Oral  SpO2: 97%  Weight: 194 lb 8 oz (88.2 kg)  Height: 5\' 8"  (1.727 m)    Body mass index is 29.57 kg/m.  Physical Exam  Constitutional: Patient appears well-developed and well-nourished. Obese  No distress.  HEENT: head atraumatic, normocephalic, pupils equal and reactive to light, ea neck supple, throat within normal limits Cardiovascular: Normal rate, regular rhythm and normal heart sounds.  No murmur heard. No BLE edema. Pulmonary/Chest: Effort normal and breath sounds normal. No respiratory distress. Abdominal: Soft.  There is no tenderness. Skin: erythematous circles , macular -papular on anterior chest  Psychiatric: Patient has a normal mood and affect. behavior is normal. Judgment and thought content normal.    PHQ2/9: Depression screen [facility] 2/9 02/27/2016 09/20/2015 05/31/2015  Decreased Interest 0 0 0  Down, Depressed, Hopeless 0 0 0  PHQ - 2 Score 0 0 0     Fall Risk: Fall Risk  02/27/2016 09/20/2015 05/31/2015  Falls in the past year? Yes No No  Number falls in past yr: 1 - -  Injury with Fall? Yes - -     Functional Status Survey: Is the patient deaf or have difficulty hearing?: No Does the patient have difficulty seeing, even when wearing glasses/contacts?: No Does the patient have difficulty concentrating,  remembering, or making decisions?: No Does the patient have difficulty walking or climbing stairs?: No Does the patient have difficulty dressing or bathing?: No Does the patient have difficulty doing errands alone such as visiting a doctor's office or shopping?: No    Assessment & Plan  1. Generalized anxiety disorder  - escitalopram (LEXAPRO) 20 MG tablet; Take 1 tablet (20 mg total) by mouth daily.  Dispense: 90 tablet; Refill: 1 - ALPRAZolam (XANAX) 0.5 MG tablet; Take 1 tablet (0.5 mg total) by mouth 2 (two) times daily as needed.  Dispense: 30 tablet; Refill: 2  2. Pituitary adenoma ([place])  Needs to [facility] with [facility]  3. B12 deficiency  Continue supplementation   4. Depression, major, recurrent, mild (HCC)  - escitalopram (LEXAPRO) 20 MG tablet; Take 1 tablet (20 mg total) by mouth daily.  Dispense:  90 tablet; Refill: 1  5. Dyslipidemia  Doing well  6. Vitamin D deficiency  Continue medication   7. Rash  - triamcinolone cream (KENALOG) 0.1 %; Apply 1 application topically 2 (two) times daily.  Dispense: 30 g; Refill: 0  8. Uterine leiomyoma, unspecified location  Continue follow up with GYN

## 2016-03-10 ENCOUNTER — Ambulatory Visit (INDEPENDENT_AMBULATORY_CARE_PROVIDER_SITE_OTHER): Payer: BLUE CROSS/BLUE SHIELD

## 2016-03-10 ENCOUNTER — Encounter: Payer: Self-pay | Admitting: Podiatry

## 2016-03-10 ENCOUNTER — Ambulatory Visit (INDEPENDENT_AMBULATORY_CARE_PROVIDER_SITE_OTHER): Payer: BLUE CROSS/BLUE SHIELD | Admitting: Podiatry

## 2016-03-10 VITALS — BP 108/69 | HR 76 | Resp 12

## 2016-03-10 DIAGNOSIS — M779 Enthesopathy, unspecified: Secondary | ICD-10-CM

## 2016-03-10 NOTE — Progress Notes (Signed)
She presents today chief complaint of pain to the second metatarsophalangeal joint area of the left foot. States that this been going on now for the past several months and has a funny sensation beneath the joint.  Objective: Vital signs are stable she is alert and oriented 3. She has pain on palpation second metatarsophalangeal joint plantar aspect. Pulses are strongly palpable radiographs 3 views taken today do not demonstrate any type of osseus abnormalities metatarsophalangeal joint other than elongated second metatarsal. She has pain on end range of motion of the second metatarsophalangeal joint as well as direct palpation of the base of phalanx plantarly.  Assessment: Capsulitis second metatarsophalangeal joint left foot.  Plan: Dispensed a Darco shoe after dexamethasone injection to the plantar lateral aspect of the second toe. Follow up with her in 1 month.

## 2016-04-16 ENCOUNTER — Ambulatory Visit: Payer: BLUE CROSS/BLUE SHIELD | Admitting: Podiatry

## 2016-05-26 DIAGNOSIS — M79673 Pain in unspecified foot: Secondary | ICD-10-CM

## 2016-05-28 ENCOUNTER — Ambulatory Visit (INDEPENDENT_AMBULATORY_CARE_PROVIDER_SITE_OTHER): Payer: BLUE CROSS/BLUE SHIELD | Admitting: Family Medicine

## 2016-05-28 ENCOUNTER — Encounter: Payer: Self-pay | Admitting: Family Medicine

## 2016-05-28 VITALS — BP 122/62 | HR 92 | Temp 98.2°F | Resp 16 | Ht 68.0 in | Wt 192.4 lb

## 2016-05-28 DIAGNOSIS — R739 Hyperglycemia, unspecified: Secondary | ICD-10-CM

## 2016-05-28 DIAGNOSIS — E538 Deficiency of other specified B group vitamins: Secondary | ICD-10-CM | POA: Diagnosis not present

## 2016-05-28 DIAGNOSIS — F411 Generalized anxiety disorder: Secondary | ICD-10-CM

## 2016-05-28 DIAGNOSIS — D259 Leiomyoma of uterus, unspecified: Secondary | ICD-10-CM | POA: Diagnosis not present

## 2016-05-28 DIAGNOSIS — J209 Acute bronchitis, unspecified: Secondary | ICD-10-CM

## 2016-05-28 DIAGNOSIS — Z79899 Other long term (current) drug therapy: Secondary | ICD-10-CM | POA: Diagnosis not present

## 2016-05-28 DIAGNOSIS — E559 Vitamin D deficiency, unspecified: Secondary | ICD-10-CM

## 2016-05-28 DIAGNOSIS — D352 Benign neoplasm of pituitary gland: Secondary | ICD-10-CM | POA: Diagnosis not present

## 2016-05-28 DIAGNOSIS — F330 Major depressive disorder, recurrent, mild: Secondary | ICD-10-CM

## 2016-05-28 DIAGNOSIS — Z23 Encounter for immunization: Secondary | ICD-10-CM

## 2016-05-28 DIAGNOSIS — E785 Hyperlipidemia, unspecified: Secondary | ICD-10-CM | POA: Diagnosis not present

## 2016-05-28 MED ORDER — ALPRAZOLAM 0.5 MG PO TABS: 0.5000 mg | ORAL_TABLET | Freq: Two times a day (BID) | ORAL | 2 refills | Status: DC | PRN

## 2016-05-28 MED ORDER — FLUTICASONE FUROATE-VILANTEROL 100-25 MCG/INH IN AEPB: 1.0000 | INHALATION_SPRAY | Freq: Every day | RESPIRATORY_TRACT | 0 refills | Status: DC

## 2016-05-28 NOTE — Progress Notes (Signed)
Name: Cynthia Bean   MRN: [id number]    DOB: 12/23/1965   Date:05/28/2016       Progress Note  Subjective  Chief Complaint  Chief Complaint  Patient presents with  . Medication Refill    3 month F/U  . Anxiety  . Chest congestion    HPI  GAD: she states her mind is always going, feels nervous, taking Alprazolam 0.5 mg at most once daily to help with symptoms, she tried Alprazolam XR 1 mg but she did not feel comfortable taking it. Taking Lexapro also, denies side effects of medication. She is aware of risks associated with BZD   Major Depression: she has been doing well on Lexapro, but feels angry, snappy, short tempered worse during her cycles. No crying spells or anhedonia.   Hyperlipidemia: she is off Mevacor, on life style modification only   Perimenopause: seen by GYN, had a normal pap, diagnosed with fibroid tumors. Cycles when present a little heavier and painful than usual, she is not interested in having surgery at this time  Right foot pain: seen by Podiatrist, diagnosed with capsulitis of right foot, she had an injection but it did not help with symptoms, she has changed shoes and symptoms are slightly better .   Hyperglycemia: she denies polyphagia or polyuria, but has polydipsia after she eats sweets  Prolactinoma : diagnosed in 2008 at [facility], she was seeing Oncologist Mamie Levers ) at [facility], took medication for one year - Dostinex , but stopped on her own. She has been refusing repeat MRI for years. She denies galactorrhea. Last Prolactin was still elevated - and advised to go back in March, but she cancelled the appointment   Tobacco use: she is willing to quit, switching to e-cigarettes as soon as it arrives in the mail  Prolactinoma: last MRI 09/2015 showed stable or smaller microadenoma. We will recheck prolactin and TSH levels   Bronchitis: she had an URI about 1 months ago with recurrence about 2 weeks ago, now symptoms settled on her chest  with congestion, dry cough and at times productive, no fever or chills. No wheezing or SOB   Patient Active Problem List   Diagnosis Date Noted  . Abnormal uterine bleeding (AUB) 02/27/2016  . Uterine fibroid 02/27/2016  . Generalized anxiety disorder 09/20/2015  . Allergic rhinitis 05/31/2015  . Depression, major, recurrent, mild ([place]) 05/31/2015  . Dyslipidemia 05/31/2015  . Blood glucose elevated 05/31/2015  . Irritable bowel syndrome with diarrhea 05/31/2015  . Female climacteric state 05/31/2015  . Pituitary adenoma ([place]) 05/31/2015  . Restless legs syndrome 05/31/2015  . B12 deficiency 05/31/2015  . Epicondylitis, lateral (tennis elbow) 05/31/2015  . Fracture, toe 04/13/2013  . Tobacco use 05/03/2009  . Vitamin D deficiency 05/03/2009    Past Surgical History:  Procedure Laterality Date  . TONSILECTOMY, ADENOIDECTOMY, BILATERAL MYRINGOTOMY AND TUBES    . TUBAL LIGATION      Family History  Problem Relation Age of Onset  . Hyperlipidemia Mother   . Hypertension Mother   . Thyroid disease Mother   . Diabetes Father   . Hypertension Father   . CAD Father   . ADD / ADHD Son   . Breast cancer Maternal Grandmother     Social History   Social History  . Marital status: Married    Spouse name: N/A  . Number of children: N/A  . Years of education: N/A   Occupational History  . Not on file.  Social History Main Topics  . Smoking status: Current Every Day Smoker    Packs/day: 0.50    Years: 30.00    Types: Cigarettes  . Smokeless tobacco: Never Used  . Alcohol use Yes     Comment: occasional  . Drug use: No  . Sexual activity: Yes    Partners: Male   Other Topics Concern  . Not on file   Social History Narrative  . No narrative on file     Current Outpatient Prescriptions:  .  ALPRAZolam (XANAX) 0.5 MG tablet, Take 1 tablet (0.5 mg total) by mouth 2 (two) times daily as needed., Disp: 30 tablet, Rfl: 2 .  Cyanocobalamin (B-12) 1000 MCG SUBL, Place  1 tablet under the tongue every other day. , Disp: , Rfl:  .  escitalopram (LEXAPRO) 20 MG tablet, Take 1 tablet (20 mg total) by mouth daily., Disp: 90 tablet, Rfl: 1  Allergies  Allergen Reactions  . Biaxin  [Clarithromycin] Other (See Comments)  . Bupropion Hcl Diarrhea  . Darvocet [Propoxyphene N-Acetaminophen] Nausea Only     ROS  Constitutional: Negative for fever or weight change.  Respiratory: Negative for cough and shortness of breath.   Cardiovascular: Negative for chest pain or palpitations.  Gastrointestinal: Negative for abdominal pain, no bowel changes.  Musculoskeletal: Negative for gait problem or joint swelling.  Skin: Negative for rash.  Neurological: Negative for dizziness or headache.  No other specific complaints in a complete review of systems (except as listed in HPI above).  Objective  Vitals:   05/28/16 1314  BP: 122/62  Pulse: 92  Resp: 16  Temp: 98.2 F (36.8 C)  TempSrc: Oral  SpO2: 95%  Weight: 192 lb 6.4 oz (87.3 kg)  Height: 5\' 8"  (1.727 m)    Body mass index is 29.25 kg/m.  Physical Exam  Constitutional: Patient appears well-developed and well-nourished. Obese  No distress.  HEENT: head atraumatic, normocephalic, pupils equal and reactive to light,  neck supple, throat within normal limits, no thyromegaly  Cardiovascular: Normal rate, regular rhythm and normal heart sounds.  No murmur heard. No BLE edema. Pulmonary/Chest: Effort normal and breath sounds normal. No respiratory distress. Abdominal: Soft.  There is no tenderness. Psychiatric: Patient has a normal mood and affect. behavior is normal. Judgment and thought content normal.  PHQ2/9: Depression screen [facility] 2/9 05/28/2016 02/27/2016 09/20/2015 05/31/2015  Decreased Interest 0 0 0 0  Down, Depressed, Hopeless 0 0 0 0  PHQ - 2 Score 0 0 0 0     Fall Risk: Fall Risk  05/28/2016 02/27/2016 09/20/2015 05/31/2015  Falls in the past year? No Yes No No  Number falls in past yr: - 1 -  -  Injury with Fall? - Yes - -     Functional Status Survey: Is the patient deaf or have difficulty hearing?: No Does the patient have difficulty seeing, even when wearing glasses/contacts?: No Does the patient have difficulty concentrating, remembering, or making decisions?: No Does the patient have difficulty walking or climbing stairs?: No Does the patient have difficulty dressing or bathing?: No Does the patient have difficulty doing errands alone such as visiting a doctor's office or shopping?: No    Assessment & Plan  1. Generalized anxiety disorder  Reminded her of possible side effects of medication  - ALPRAZolam (XANAX) 0.5 MG tablet; Take 1 tablet (0.5 mg total) by mouth 2 (two) times daily as needed.  Dispense: 30 tablet; Refill: 2 - TSH  2. Needs flu shot  -  Flu Vaccine QUAD 36+ mos PF IM (Fluarix & Fluzone Quad PF)  3. B12 deficiency  - Vitamin B12  4. Depression, major, recurrent, mild (HCC)  Stable on medication   5. Uterine leiomyoma, unspecified location  Seen by GYN, no significant problems   6. Vitamin D deficiency  - VITAMIN D 25 Hydroxy (Vit-D Deficiency, Fractures)  7. Dyslipidemia  - Lipid panel  8. Prolactinoma ([place])  - Prolactin  9. Blood glucose elevated  - Hemoglobin A1c  10. Long-term use of high-risk medication  - CBC with Differential/Platelet - COMPLETE METABOLIC PANEL WITH GFR  11. Acute bronchitis  - fluticasone furoate-vilanterol (BREO ELLIPTA) 100-25 MCG/INH AEPB; Inhale 1 puff into the lungs daily.  Dispense: 60 each; Refill: 0

## 2016-05-29 LAB — CBC WITH DIFFERENTIAL/PLATELET
BASOS PCT: 0 %
Basophils Absolute: 0 cells/uL (ref 0–200)
EOS PCT: 1 %
Eosinophils Absolute: 59 cells/uL (ref 15–500)
HCT: 40.1 % (ref 35.0–45.0)
Hemoglobin: 13.7 g/dL (ref 11.7–15.5)
LYMPHS PCT: 24 %
Lymphs Abs: 1416 cells/uL (ref 850–3900)
MCH: 32.5 pg (ref 27.0–33.0)
MCHC: 34.2 g/dL (ref 32.0–36.0)
MCV: 95.2 fL (ref 80.0–100.0)
MONO ABS: 590 {cells}/uL (ref 200–950)
MONOS PCT: 10 %
MPV: 9.5 fL (ref 7.5–12.5)
Neutro Abs: 3835 cells/uL (ref 1500–7800)
Neutrophils Relative %: 65 %
PLATELETS: 289 10*3/uL (ref 140–400)
RBC: 4.21 MIL/uL (ref 3.80–5.10)
RDW: 13.1 % (ref 11.0–15.0)
WBC: 5.9 10*3/uL (ref 3.8–10.8)

## 2016-05-29 LAB — COMPLETE METABOLIC PANEL WITH GFR
ALT: 9 U/L (ref 6–29)
AST: 13 U/L (ref 10–35)
Albumin: 4 g/dL (ref 3.6–5.1)
Alkaline Phosphatase: 87 U/L (ref 33–130)
BUN: 9 mg/dL (ref 7–25)
CALCIUM: 9.1 mg/dL (ref 8.6–10.4)
CHLORIDE: 105 mmol/L (ref 98–110)
CO2: 25 mmol/L (ref 20–31)
CREATININE: 0.96 mg/dL (ref 0.50–1.05)
GFR, Est African American: 79 mL/min (ref >=60)
GFR, Est Non African American: 69 mL/min (ref >=60)
Glucose, Bld: 90 mg/dL (ref 65–99)
POTASSIUM: 4.5 mmol/L (ref 3.5–5.3)
Sodium: 138 mmol/L (ref 135–146)
Total Bilirubin: 0.5 mg/dL (ref 0.2–1.2)
Total Protein: 6.6 g/dL (ref 6.1–8.1)

## 2016-05-29 LAB — LIPID PANEL
CHOL/HDL RATIO: 5.6 ratio — AB (ref <5.0)
CHOLESTEROL: 234 mg/dL — AB (ref <200)
HDL: 42 mg/dL — AB (ref >50)
LDL Cholesterol: 173 mg/dL — ABNORMAL HIGH (ref <100)
Triglycerides: 95 mg/dL (ref <150)
VLDL: 19 mg/dL (ref <30)

## 2016-05-29 LAB — TSH: TSH: 1.5 mIU/L

## 2016-05-30 ENCOUNTER — Other Ambulatory Visit: Payer: Self-pay | Admitting: Family Medicine

## 2016-05-30 ENCOUNTER — Encounter: Payer: Self-pay | Admitting: Family Medicine

## 2016-05-30 LAB — VITAMIN D 25 HYDROXY (VIT D DEFICIENCY, FRACTURES): Vit D, 25-Hydroxy: 29 ng/mL — ABNORMAL LOW (ref 30–100)

## 2016-05-30 LAB — VITAMIN B12: VITAMIN B 12: 558 pg/mL (ref 200–1100)

## 2016-05-30 LAB — HEMOGLOBIN A1C
HEMOGLOBIN A1C: 5.2 % (ref <5.7)
MEAN PLASMA GLUCOSE: 103 mg/dL

## 2016-05-30 LAB — PROLACTIN: PROLACTIN: 15.4 ng/mL

## 2016-05-30 MED ORDER — LOVASTATIN 40 MG PO TABS: 40.0000 mg | ORAL_TABLET | Freq: Every day | ORAL | 1 refills | Status: DC

## 2016-06-11 DIAGNOSIS — L821 Other seborrheic keratosis: Secondary | ICD-10-CM | POA: Diagnosis not present

## 2016-06-11 DIAGNOSIS — L570 Actinic keratosis: Secondary | ICD-10-CM | POA: Diagnosis not present

## 2016-06-16 ENCOUNTER — Ambulatory Visit: Payer: BLUE CROSS/BLUE SHIELD | Admitting: Podiatry

## 2016-06-24 ENCOUNTER — Other Ambulatory Visit: Payer: Self-pay | Admitting: Family Medicine

## 2016-06-24 DIAGNOSIS — J209 Acute bronchitis, unspecified: Secondary | ICD-10-CM

## 2016-06-24 NOTE — Telephone Encounter (Signed)
Patient requesting refill of Breo to [facility].  

## 2016-07-28 ENCOUNTER — Other Ambulatory Visit: Payer: Self-pay | Admitting: Family Medicine

## 2016-07-28 DIAGNOSIS — J209 Acute bronchitis, unspecified: Secondary | ICD-10-CM

## 2016-07-28 NOTE — Telephone Encounter (Signed)
Patient requesting refill of Breo to [facility].  

## 2016-09-09 ENCOUNTER — Other Ambulatory Visit: Payer: Self-pay

## 2016-09-09 DIAGNOSIS — F330 Major depressive disorder, recurrent, mild: Secondary | ICD-10-CM

## 2016-09-09 DIAGNOSIS — F411 Generalized anxiety disorder: Secondary | ICD-10-CM

## 2016-09-09 MED ORDER — ESCITALOPRAM OXALATE 20 MG PO TABS: 20.0000 mg | ORAL_TABLET | Freq: Every day | ORAL | 0 refills | Status: DC

## 2016-09-09 NOTE — Telephone Encounter (Signed)
Patient requesting refill of Escitalopram for a 90 day supply.

## 2016-09-25 ENCOUNTER — Other Ambulatory Visit: Payer: Self-pay | Admitting: Family Medicine

## 2016-09-25 DIAGNOSIS — F411 Generalized anxiety disorder: Secondary | ICD-10-CM

## 2016-09-25 DIAGNOSIS — F330 Major depressive disorder, recurrent, mild: Secondary | ICD-10-CM

## 2016-09-26 NOTE — Telephone Encounter (Signed)
Patient requesting refill of Lexapro to [facility].

## 2016-11-17 ENCOUNTER — Encounter: Payer: Self-pay | Admitting: Family Medicine

## 2016-11-17 ENCOUNTER — Ambulatory Visit (INDEPENDENT_AMBULATORY_CARE_PROVIDER_SITE_OTHER): Payer: BLUE CROSS/BLUE SHIELD | Admitting: Family Medicine

## 2016-11-17 VITALS — BP 110/72 | HR 83 | Temp 98.2°F | Resp 16 | Ht 68.0 in | Wt 186.6 lb

## 2016-11-17 DIAGNOSIS — F411 Generalized anxiety disorder: Secondary | ICD-10-CM

## 2016-11-17 DIAGNOSIS — E785 Hyperlipidemia, unspecified: Secondary | ICD-10-CM | POA: Diagnosis not present

## 2016-11-17 DIAGNOSIS — F330 Major depressive disorder, recurrent, mild: Secondary | ICD-10-CM

## 2016-11-17 DIAGNOSIS — E538 Deficiency of other specified B group vitamins: Secondary | ICD-10-CM | POA: Diagnosis not present

## 2016-11-17 DIAGNOSIS — N926 Irregular menstruation, unspecified: Secondary | ICD-10-CM | POA: Diagnosis not present

## 2016-11-17 DIAGNOSIS — Z79899 Other long term (current) drug therapy: Secondary | ICD-10-CM | POA: Diagnosis not present

## 2016-11-17 DIAGNOSIS — D352 Benign neoplasm of pituitary gland: Secondary | ICD-10-CM | POA: Diagnosis not present

## 2016-11-17 DIAGNOSIS — N930 Postcoital and contact bleeding: Secondary | ICD-10-CM | POA: Diagnosis not present

## 2016-11-17 DIAGNOSIS — J069 Acute upper respiratory infection, unspecified: Secondary | ICD-10-CM

## 2016-11-17 DIAGNOSIS — E559 Vitamin D deficiency, unspecified: Secondary | ICD-10-CM | POA: Diagnosis not present

## 2016-11-17 MED ORDER — ALPRAZOLAM 0.5 MG PO TABS: 0.5000 mg | ORAL_TABLET | Freq: Two times a day (BID) | ORAL | 2 refills | Status: DC | PRN

## 2016-11-17 MED ORDER — ESCITALOPRAM OXALATE 20 MG PO TABS: 20.0000 mg | ORAL_TABLET | Freq: Every day | ORAL | 0 refills | Status: DC

## 2016-11-17 MED ORDER — LOVASTATIN 40 MG PO TABS: 40.0000 mg | ORAL_TABLET | Freq: Every day | ORAL | 1 refills | Status: DC

## 2016-11-17 NOTE — Progress Notes (Signed)
Name: Cynthia Bean   MRN: [id number]    DOB: 01/21/1966   Date:11/17/2016       Progress Note  Subjective  Chief Complaint  Chief Complaint  Patient presents with  . Anxiety  . Medication Refill    HPI  GAD: she states her mind is always going, feels nervous, taking Alprazolam 0.5 mg at most once daily to help with symptoms, she tried Alprazolam XR 1 mg but she did not feel comfortable taking it. Taking Lexapro also, denies side effects of medication. She is aware of risks associated with BZD. Last rx was written 05/2016 for 30 and 2 refills and it lasted 6 months  Major Depression: she has been doing well on Lexapro, she is doing better, not as angry or snappy.  Hyperlipidemia: she is off Mevacor, she has been taking Mevacor since 05/2016  But skips days, we will recheck labs  Perimenopause: seen by GYN, had a normal pap, diagnosed with fibroid tumors. Cycles are heavy and having cycles twice monthly. She is not interested in having any procedures. She has also noticed post-coital bleeding, seen by GYN last year, but I could not find pap smear results. Advised to return for CPE and pap smear - she will call gyn at [facility]  Hyperglycemia: she denies polyphagia or polyuria, but has polydipsia intermittently. She is eating healthier and avoiding sweets.  Prolactinoma : diagnosed in 2008 at [facility], she was seeing Oncologist Mamie Levers ) at [facility], took medication for one year - Dostinex , but stopped on her own. She had a repeat MRI 09/2015 that showed that microadenoma was stable in size.  She denies galactorrhea. Last Prolactin was back to normal , but her cycles are getting more frequent again, twice month  Tobacco use: she is willing to quit, she switched to e-cigarettes as is cutting down on the amount per day, not gaining weight.   URI: symptoms started about 10 days ago, at times clear rhinorrhea , yellow or green snot, some frontal pressure and ears feels  congested, she has post-nasal drainage, no fever or chills. Symptoms are worse at night, she is feeling better now, she did not take any medication today, she states she had chills last night.    Patient Active Problem List   Diagnosis Date Noted  . Abnormal uterine bleeding (AUB) 02/27/2016  . Uterine fibroid 02/27/2016  . Generalized anxiety disorder 09/20/2015  . Allergic rhinitis 05/31/2015  . Depression, major, recurrent, mild ([place]) 05/31/2015  . Dyslipidemia 05/31/2015  . Blood glucose elevated 05/31/2015  . Irritable bowel syndrome with diarrhea 05/31/2015  . Female climacteric state 05/31/2015  . Pituitary adenoma ([place]) 05/31/2015  . Restless legs syndrome 05/31/2015  . B12 deficiency 05/31/2015  . Epicondylitis, lateral (tennis elbow) 05/31/2015  . Fracture, toe 04/13/2013  . Tobacco use 05/03/2009  . Vitamin D deficiency 05/03/2009    Past Surgical History:  Procedure Laterality Date  . TONSILECTOMY, ADENOIDECTOMY, BILATERAL MYRINGOTOMY AND TUBES    . TUBAL LIGATION      Family History  Problem Relation Age of Onset  . Hyperlipidemia Mother   . Hypertension Mother   . Thyroid disease Mother   . Diabetes Father   . Hypertension Father   . CAD Father   . ADD / ADHD Son   . Breast cancer Maternal Grandmother     Social History   Social History  . Marital status: Married    Spouse name: N/A  . Number of  children: N/A  . Years of education: N/A   Occupational History  . Not on file.   Social History Main Topics  . Smoking status: Current Every Day Smoker    Packs/day: 0.50    Years: 30.00    Types: Cigarettes  . Smokeless tobacco: Never Used  . Alcohol use Yes     Comment: occasional  . Drug use: No  . Sexual activity: Yes    Partners: Male   Other Topics Concern  . Not on file   Social History Narrative  . No narrative on file     Current Outpatient Prescriptions:  .  ALPRAZolam (XANAX) 0.5 MG tablet, Take 1 tablet (0.5 mg total) by mouth  2 (two) times daily as needed., Disp: 30 tablet, Rfl: 2 .  Cyanocobalamin (B-12) 1000 MCG SUBL, Place 1 tablet under the tongue every other day. , Disp: , Rfl:  .  escitalopram (LEXAPRO) 20 MG tablet, Take 1 tablet (20 mg total) by mouth daily., Disp: 90 tablet, Rfl: 0 .  lovastatin (MEVACOR) 40 MG tablet, Take 1 tablet (40 mg total) by mouth at bedtime., Disp: 90 tablet, Rfl: 1  Allergies  Allergen Reactions  . Biaxin  [Clarithromycin] Other (See Comments)  . Bupropion Hcl Diarrhea  . Darvocet [Propoxyphene N-Acetaminophen] Nausea Only     ROS  Constitutional: Negative for fever or weight change.  Respiratory: Negative for cough and shortness of breath.   Cardiovascular: Negative for chest pain or palpitations.  Gastrointestinal: Negative for abdominal pain, no bowel changes.  Musculoskeletal: Negative for gait problem or joint swelling.  Skin: Negative for rash.  Neurological: Negative for dizziness or headache.  No other specific complaints in a complete review of systems (except as listed in HPI above).  Objective  Vitals:   11/17/16 1133  BP: 110/72  Pulse: 83  Resp: 16  Temp: 98.2 F (36.8 C)  SpO2: 95%  Weight: 186 lb 9 oz (84.6 kg)  Height: 5\' 8"  (1.727 m)    Body mass index is 28.37 kg/m.  Physical Exam  Constitutional: Patient appears well-developed and well-nourished. Overweight   No distress.  HEENT: head atraumatic, normocephalic, pupils equal and reactive to light,  neck supple, throat within normal limits Cardiovascular: Normal rate, regular rhythm and normal heart sounds.  No murmur heard. No BLE edema. Pulmonary/Chest: Effort normal and breath sounds normal. No respiratory distress. Abdominal: Soft.  There is no tenderness. Psychiatric: Patient has a normal mood and affect. behavior is normal. Judgment and thought content normal.  PHQ2/9: Depression screen [facility] 2/9 05/28/2016 02/27/2016 09/20/2015 05/31/2015  Decreased Interest 0 0 0 0  Down,  Depressed, Hopeless 0 0 0 0  PHQ - 2 Score 0 0 0 0     Fall Risk: Fall Risk  05/28/2016 02/27/2016 09/20/2015 05/31/2015  Falls in the past year? No Yes No No  Number falls in past yr: - 1 - -  Injury with Fall? - Yes - -      Assessment & Plan  1. Depression, major, recurrent, mild (HCC)  - escitalopram (LEXAPRO) 20 MG tablet; Take 1 tablet (20 mg total) by mouth daily.  Dispense: 90 tablet; Refill: 0  2. B12 deficiency  Taking otc supplementation   3. Vitamin D deficiency  Continue supplementation   4. Dyslipidemia  Taking medication only every other day, we will recheck labs - lovastatin (MEVACOR) 40 MG tablet; Take 1 tablet (40 mg total) by mouth at bedtime.  Dispense: 90 tablet; Refill: 1 -  Lipid panel - comp panel   5. Generalized anxiety disorder  - escitalopram (LEXAPRO) 20 MG tablet; Take 1 tablet (20 mg total) by mouth daily.  Dispense: 90 tablet; Refill: 0 - ALPRAZolam (XANAX) 0.5 MG tablet; Take 1 tablet (0.5 mg total) by mouth 2 (two) times daily as needed.  Dispense: 30 tablet; Refill: 2  6. Prolactinoma ([place])  - Prolactin  7. Irregular periods/menstrual cycles  - TSH  8. Long-term use of high-risk medication  - COMPLETE METABOLIC PANEL WITH GFR  9. Acute URI  Discussed likely viral and since already improving , discussed Mucinex, fluids, nasal steroid and saline spray Call back for antibiotics if symptoms gets worse by the end of this week  10. PCB (post coital bleeding)  She will go back to gyn - explained to scheduled it asap

## 2016-11-21 ENCOUNTER — Other Ambulatory Visit: Payer: Self-pay | Admitting: Family Medicine

## 2016-11-21 ENCOUNTER — Telehealth: Payer: Self-pay | Admitting: Family Medicine

## 2016-11-21 MED ORDER — AMOXICILLIN-POT CLAVULANATE 875-125 MG PO TABS: 1.0000 | ORAL_TABLET | Freq: Two times a day (BID) | ORAL | 0 refills | Status: DC

## 2016-11-21 NOTE — Telephone Encounter (Signed)
Pt was told that if she needs antibiotic for her to call and it could be sent to [place]

## 2016-11-21 NOTE — Telephone Encounter (Signed)
Pt informed and says thank you.

## 2016-11-21 NOTE — Telephone Encounter (Signed)
Sent augmentin

## 2016-11-27 ENCOUNTER — Encounter: Payer: Self-pay | Admitting: Family Medicine

## 2016-12-29 ENCOUNTER — Ambulatory Visit: Payer: BLUE CROSS/BLUE SHIELD | Admitting: Family Medicine

## 2017-03-24 IMAGING — MR MR HEAD WO/W CM
14 of 17 series · 45 of 48 positions shown · IV contrast (multihance)
Comparison: 03/14/2009

CLINICAL DATA: Prolactinoma follow-up

EXAM:
MRI HEAD WITHOUT AND WITH CONTRAST
TECHNIQUE: Multiplanar, multiecho pulse sequences of the brain and surrounding
structures were obtained without and with intravenous contrast.
CONTRAST:  10mL MULTIHANCE GADOBENATE DIMEGLUMINE 529 MG/ML IV SOLN

[Series 2: T1 · sagittal · 5.0mm · 0.45mm/px · 3 of 23 slices shown (1 of 4)]
[im 1/23]
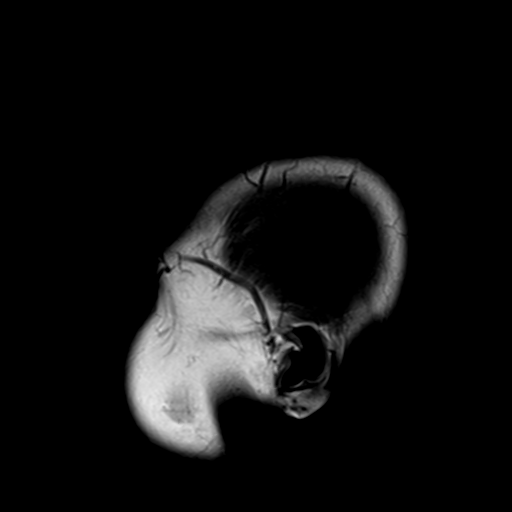
[im 12/23]
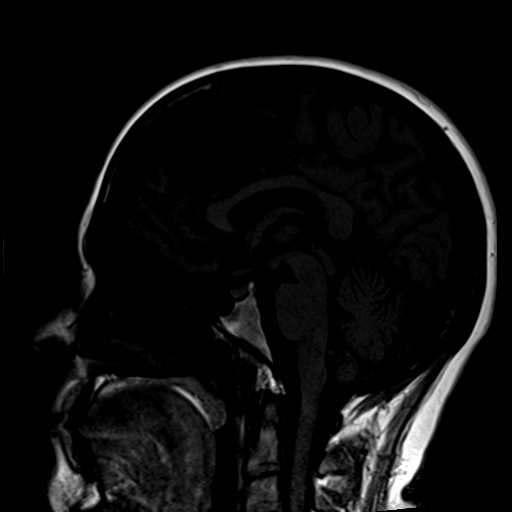
[im 23/23]
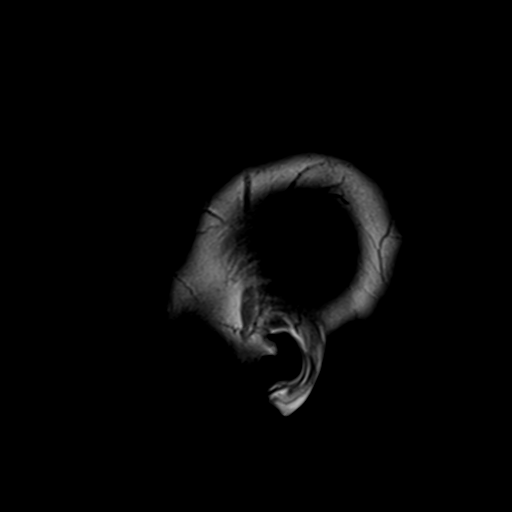

[Series 4: DWI · axial · 3.0mm · 1.20mm/px · z∈[-57,+105]mm · 6 of 55 slices shown (1 of 2)]
[im 1/55]
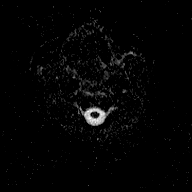
[im 11/55]
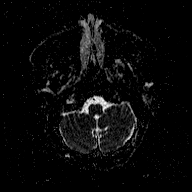
[im 22/55]
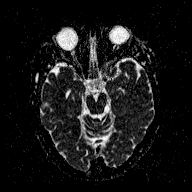
[im 33/55]
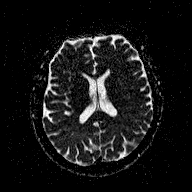
[im 44/55]
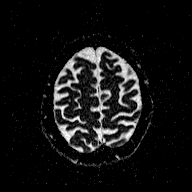
[im 55/55]
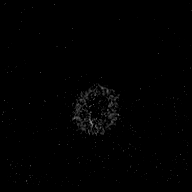

[Series 5: T2 · axial · 5.0mm · 0.72mm/px · z∈[-57,+105]mm · 3 of 26 slices shown (1 of 2)]
[im 1/26]
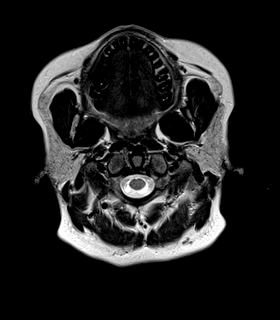
[im 13/26]
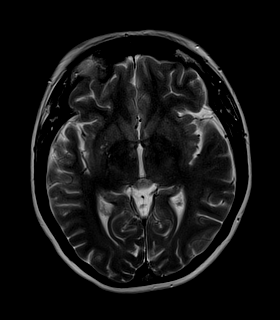
[im 26/26]
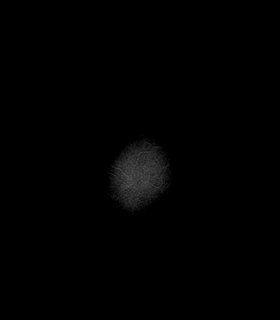

[Series 6: FLAIR · axial · 5.0mm · 0.45mm/px · z∈[-57,+105]mm · 3 of 26 slices shown]
[im 1/26]
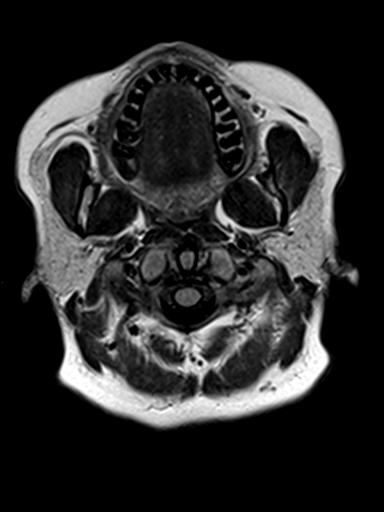
[im 13/26]
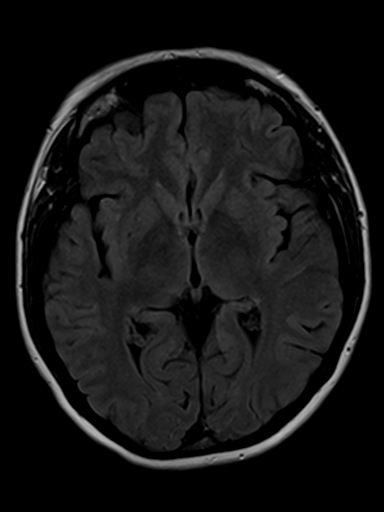
[im 26/26]
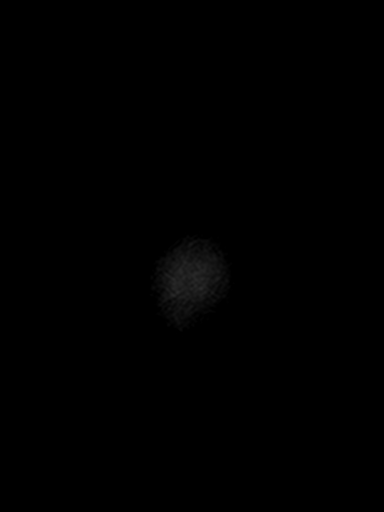

[Series 7: T2 · axial · 5.0mm · 0.72mm/px · z∈[-57,+105]mm · 3 of 26 slices shown (2 of 2)]
[im 1/26]
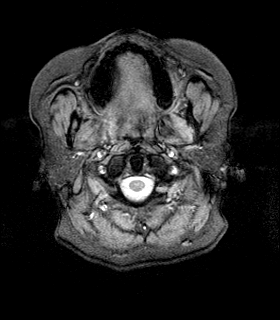
[im 13/26]
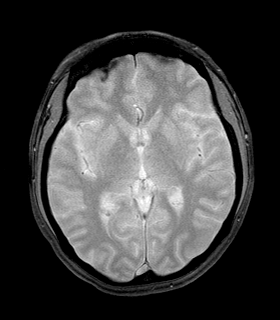
[im 26/26]
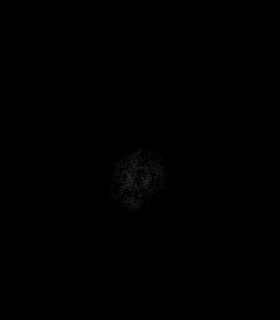

[Series 8: T1 · sagittal · 3.0mm · 0.53mm/px · 2 of 15 slices shown (2 of 4)]
[im 1/15]
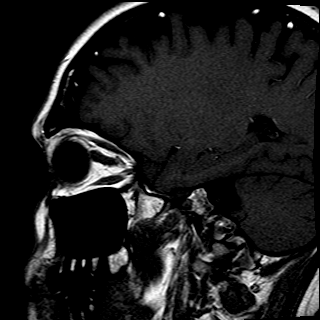
[im 15/15]
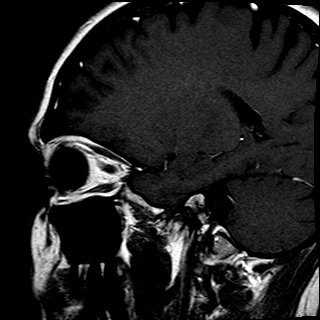

[Series 9: T1 · coronal · 3.0mm · 0.44mm/px · 2 of 15 slices shown (3 of 4)]
[im 1/15]
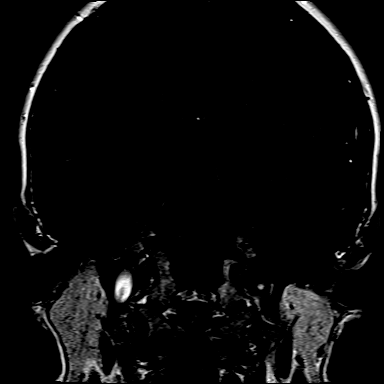
[im 15/15]
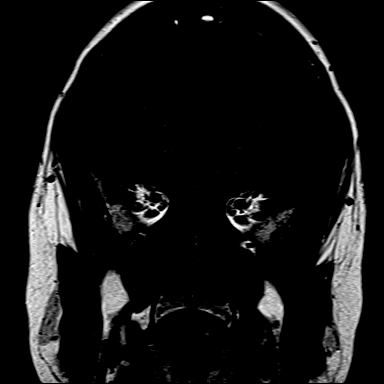

[Series 16: T1 post-contrast · sagittal · 3.0mm · 0.53mm/px · 2 of 15 slices shown (1 of 3)]
[im 1/15]
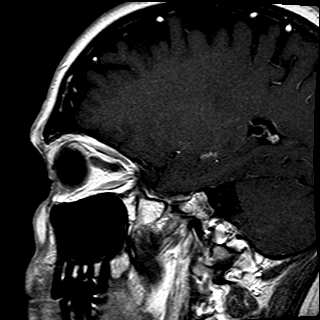
[im 15/15]
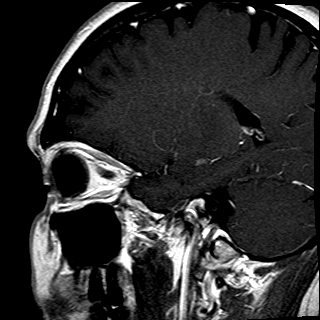

[Series 17: T1 · coronal · 3.0mm · 0.44mm/px · 2 of 15 slices shown (4 of 4)]
[im 1/15]
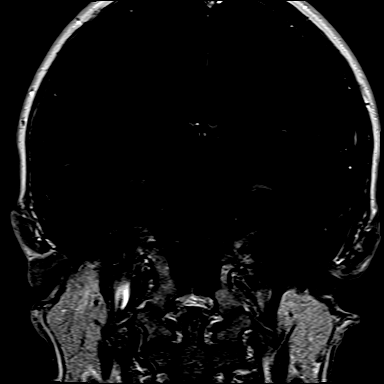
[im 15/15]
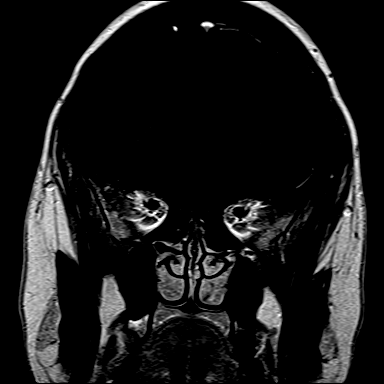

[Series 18: T1 post-contrast · axial · 3.0mm · 1.00mm/px · z∈[-69,+119]mm · 7 of 64 slices shown (2 of 3)]
[im 1/64]
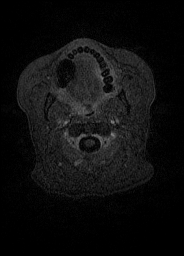
[im 11/64]
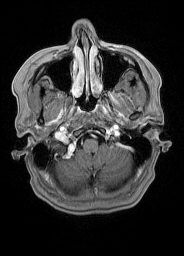
[im 22/64]
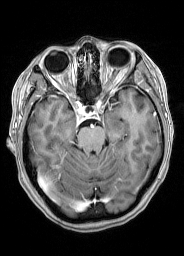
[im 32/64]
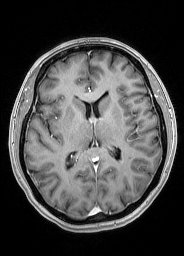
[im 43/64]
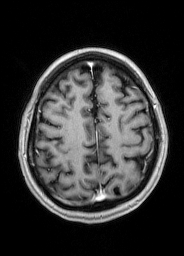
[im 53/64]
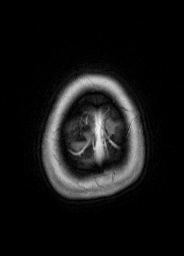
[im 64/64]
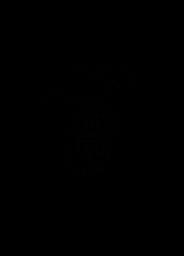

[Series 19: T1 post-contrast · coronal · 4.0mm · 0.45mm/px · 4 of 36 slices shown (3 of 3)]
[im 1/36]
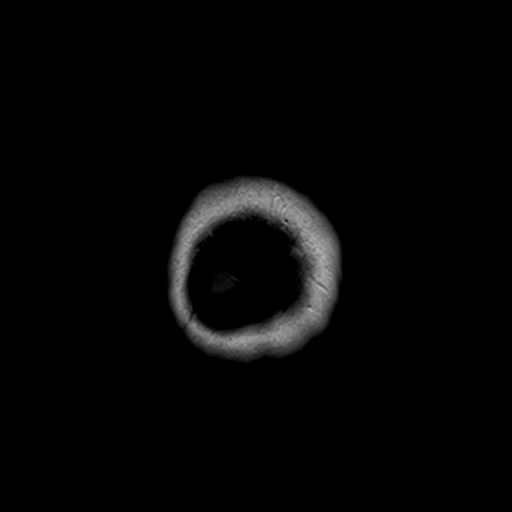
[im 12/36]
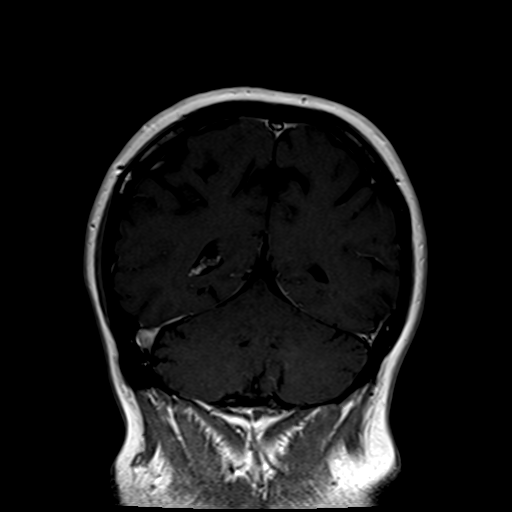
[im 24/36]
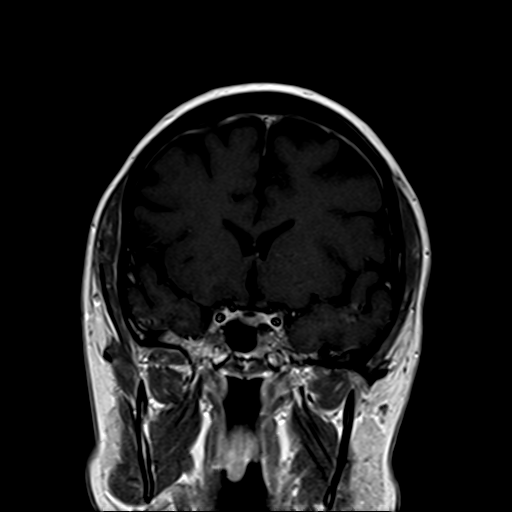
[im 36/36]
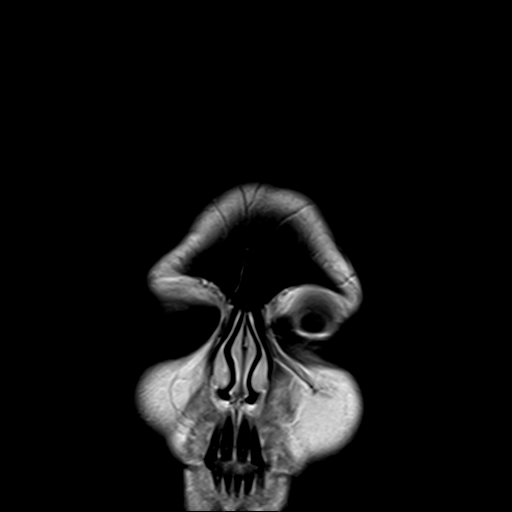

[Series 100: DWI · axial · 3.0mm · 1.20mm/px · z∈[-57,+105]mm · 6 of 55 slices shown (2 of 2)]
[im 1/55]
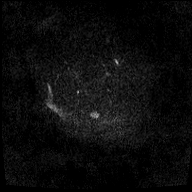
[im 11/55]
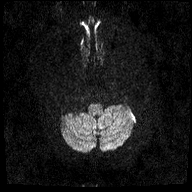
[im 22/55]
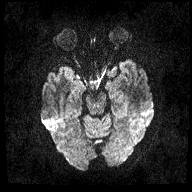
[im 33/55]
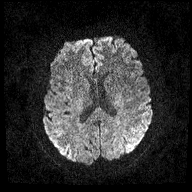
[im 44/55]
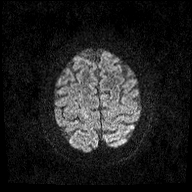
[im 55/55]
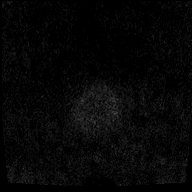

[Series 101: 1st pass · coronal · 3.0mm · 0.62mm/px · 1 of 12 slices shown]
[im 1/12]
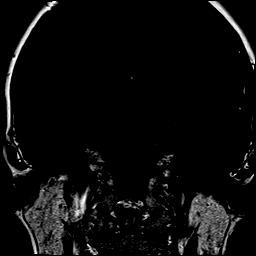

[Series 102: 2nd pass · coronal · 3.0mm · 0.62mm/px · 1 of 12 slices shown]
[im 1/12]
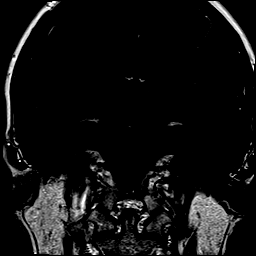

[45 of 48 positions shown; findings below may reference images not displayed]

FINDINGS: Calvarium and upper cervical spine: No focal marrow signal
abnormality.

Orbits: Negative.

Sinuses and Mastoids: Clear.

Brain: Stable subtle left pituitary gland hypoenhancing nodule only
seen on dynamic sequence. This nodule is stable to decreased at 3-4
mm. Normal appearance of the cavernous sinuses, chiasm, and
suprasellar cistern.

Otherwise unremarkable appearance of the brain. No acute or previous
infarct, hemorrhage, hydrocephalus, or atrophy. Few T2 and FLAIR
hyperintense foci in the cerebral white matter, commonly encountered
at this age and from microvascular ischemia.
IMPRESSION: Stable or smaller (3 mm) presumed microadenoma.

## 2017-03-26 ENCOUNTER — Other Ambulatory Visit: Payer: Self-pay | Admitting: Family Medicine

## 2017-03-26 DIAGNOSIS — F411 Generalized anxiety disorder: Secondary | ICD-10-CM

## 2017-03-26 DIAGNOSIS — F330 Major depressive disorder, recurrent, mild: Secondary | ICD-10-CM

## 2017-04-08 DIAGNOSIS — N930 Postcoital and contact bleeding: Secondary | ICD-10-CM | POA: Diagnosis not present

## 2017-04-08 DIAGNOSIS — N941 Unspecified dyspareunia: Secondary | ICD-10-CM | POA: Diagnosis not present

## 2017-04-08 DIAGNOSIS — Z01419 Encounter for gynecological examination (general) (routine) without abnormal findings: Secondary | ICD-10-CM | POA: Diagnosis not present

## 2017-04-10 DIAGNOSIS — R87610 Atypical squamous cells of undetermined significance on cytologic smear of cervix (ASC-US): Secondary | ICD-10-CM | POA: Insufficient documentation

## 2017-05-11 DIAGNOSIS — Z23 Encounter for immunization: Secondary | ICD-10-CM | POA: Diagnosis not present

## 2017-05-16 ENCOUNTER — Other Ambulatory Visit: Payer: Self-pay | Admitting: Family Medicine

## 2017-05-16 DIAGNOSIS — E785 Hyperlipidemia, unspecified: Secondary | ICD-10-CM

## 2017-05-22 ENCOUNTER — Encounter: Payer: Self-pay | Admitting: Family Medicine

## 2017-05-22 ENCOUNTER — Ambulatory Visit (INDEPENDENT_AMBULATORY_CARE_PROVIDER_SITE_OTHER): Payer: BLUE CROSS/BLUE SHIELD | Admitting: Family Medicine

## 2017-05-22 VITALS — BP 112/76 | HR 78 | Temp 97.6°F | Resp 16 | Ht 68.0 in | Wt 194.6 lb

## 2017-05-22 DIAGNOSIS — R87610 Atypical squamous cells of undetermined significance on cytologic smear of cervix (ASC-US): Secondary | ICD-10-CM

## 2017-05-22 DIAGNOSIS — F411 Generalized anxiety disorder: Secondary | ICD-10-CM

## 2017-05-22 DIAGNOSIS — M5410 Radiculopathy, site unspecified: Secondary | ICD-10-CM | POA: Diagnosis not present

## 2017-05-22 DIAGNOSIS — E559 Vitamin D deficiency, unspecified: Secondary | ICD-10-CM

## 2017-05-22 DIAGNOSIS — E785 Hyperlipidemia, unspecified: Secondary | ICD-10-CM | POA: Diagnosis not present

## 2017-05-22 DIAGNOSIS — N926 Irregular menstruation, unspecified: Secondary | ICD-10-CM | POA: Diagnosis not present

## 2017-05-22 DIAGNOSIS — M7751 Other enthesopathy of right foot: Secondary | ICD-10-CM | POA: Diagnosis not present

## 2017-05-22 DIAGNOSIS — E538 Deficiency of other specified B group vitamins: Secondary | ICD-10-CM | POA: Diagnosis not present

## 2017-05-22 DIAGNOSIS — F330 Major depressive disorder, recurrent, mild: Secondary | ICD-10-CM

## 2017-05-22 DIAGNOSIS — D352 Benign neoplasm of pituitary gland: Secondary | ICD-10-CM

## 2017-05-22 MED ORDER — ALPRAZOLAM 0.5 MG PO TABS: 0.5000 mg | ORAL_TABLET | Freq: Two times a day (BID) | ORAL | 2 refills | Status: DC | PRN

## 2017-05-22 MED ORDER — PREDNISONE 10 MG (48) PO TBPK: ORAL_TABLET | ORAL | 0 refills | Status: DC

## 2017-05-22 MED ORDER — ESCITALOPRAM OXALATE 20 MG PO TABS: 20.0000 mg | ORAL_TABLET | Freq: Every day | ORAL | 1 refills | Status: DC

## 2017-05-22 NOTE — Progress Notes (Signed)
Name: Cynthia Bean   MRN: [id number]    DOB: 18-Oct-1965   Date:05/22/2017       Progress Note  Subjective  Chief Complaint  Chief Complaint  Patient presents with  . Follow-up    6 mnths  . Medication Refill    xanax and lexapro    HPI  GAD: she states her mind is always going, feels nervous, taking Alprazolam 0.5 mg at most once daily to help with symptoms, she tried Alprazolam XR 1 mg but she did not feel comfortable taking it. Taking Lexapro also, denies side effects of medication. Takes alprazolam prn and needs a refill  Major Depression: she has been doing well on Lexapro, but feels angry, snappy, short tempered worse during her cycles. No crying spells. She is late on her cycle, 3 weeks and states her mood is very good now.   Hyperlipidemia: taking Mevacor every other day and denies side effects, no chest pain  Perimenopause: seen by GYN, had a normal pap, diagnosed with fibroid tumors. Cycles are skipping now.   Hyperglycemia: she denies polyphagia, polydipsia or polyuria  Prolactinoma : diagnosed in 2008 at [facility], she was seeing Oncologist Mamie Levers ) at [facility], took medication for one year - Dostinex , but stopped on her own. She has been refusing repeat MRI for years. She denies galactorrhea. Last Prolactin was back to normal.   Low back pain with radiculitis: she states that over the past couple of weeks, pain is constant , but radiating to left lateral leg Shooting pain, at times all the way down to her foot..   Patient Active Problem List   Diagnosis Date Noted  . ASCUS of cervix with negative high risk HPV 04/10/2017  . Abnormal uterine bleeding (AUB) 02/27/2016  . Uterine fibroid 02/27/2016  . Generalized anxiety disorder 09/20/2015  . Allergic rhinitis 05/31/2015  . Depression, major, recurrent, mild ([place]) 05/31/2015  . Dyslipidemia 05/31/2015  . Blood glucose elevated 05/31/2015  . Irritable bowel syndrome with diarrhea 05/31/2015  .  Female climacteric state 05/31/2015  . Pituitary adenoma ([place]) 05/31/2015  . Restless legs syndrome 05/31/2015  . B12 deficiency 05/31/2015  . Epicondylitis, lateral (tennis elbow) 05/31/2015  . Fracture, toe 04/13/2013  . Tobacco use 05/03/2009  . Vitamin D deficiency 05/03/2009    Past Surgical History:  Procedure Laterality Date  . TONSILECTOMY, ADENOIDECTOMY, BILATERAL MYRINGOTOMY AND TUBES    . TUBAL LIGATION      Family History  Problem Relation Age of Onset  . Hyperlipidemia Mother   . Hypertension Mother   . Thyroid disease Mother   . Diabetes Father   . Hypertension Father   . CAD Father   . ADD / ADHD Son   . Breast cancer Maternal Grandmother     Social History   Socioeconomic History  . Marital status: Married    Spouse name: Not on file  . Number of children: Not on file  . Years of education: Not on file  . Highest education level: Not on file  Social Needs  . Financial resource strain: Not on file  . Food insecurity - worry: Not on file  . Food insecurity - inability: Not on file  . Transportation needs - medical: Not on file  . Transportation needs - non-medical: Not on file  Occupational History  . Not on file  Tobacco Use  . Smoking status: Former Smoker    Packs/day: 0.50    Years: 30.00  Pack years: 15.00    Types: Cigarettes  . Smokeless tobacco: Former Systems developer    Quit date: 08/06/2016  . Tobacco comment: switched to e-cigarettes  Substance and Sexual Activity  . Alcohol use: Yes    Comment: occasional  . Drug use: No  . Sexual activity: Yes    Partners: Male  Other Topics Concern  . Not on file  Social History Narrative   Married     Current Outpatient Medications:  .  ALPRAZolam (XANAX) 0.5 MG tablet, Take 1 tablet (0.5 mg total) by mouth 2 (two) times daily as needed., Disp: 30 tablet, Rfl: 2 .  Cyanocobalamin (B-12) 1000 MCG SUBL, Place 1 tablet under the tongue every other day. , Disp: , Rfl:  .  escitalopram (LEXAPRO) 20 MG  tablet, TAKE 1 TABLET BY MOUTH DAILY, Disp: 90 tablet, Rfl: 0 .  lovastatin (MEVACOR) 40 MG tablet, Take 1 tablet (40 mg total) by mouth at bedtime., Disp: 90 tablet, Rfl: 1  Allergies  Allergen Reactions  . Biaxin  [Clarithromycin] Other (See Comments)  . Bupropion Hcl Diarrhea  . Darvocet [Propoxyphene N-Acetaminophen] Nausea Only     ROS  Constitutional: Negative for fever, positive for  weight change.  Respiratory: Negative for cough and shortness of breath.   Cardiovascular: Negative for chest pain or palpitations.  Gastrointestinal: Negative for abdominal pain, no bowel changes.  Musculoskeletal: Positive  for gait problem no  joint swelling.  Skin: Negative for rash.  Neurological: Negative for dizziness or headache.  No other specific complaints in a complete review of systems (except as listed in HPI above).  Objective  Vitals:   05/22/17 1405  BP: 112/76  Pulse: 78  Resp: 16  Temp: 97.6 F (36.4 C)  TempSrc: Oral  SpO2: 97%  Weight: 194 lb 9.6 oz (88.3 kg)  Height: 5\' 8"  (1.727 m)    Body mass index is 29.59 kg/m.  Physical Exam  Constitutional: Patient appears well-developed and well-nourished. Obese  No distress.  HEENT: head atraumatic, normocephalic, pupils equal and reactive to light,neck supple, throat within normal limits Cardiovascular: Normal rate, regular rhythm and normal heart sounds.  No murmur heard. No BLE edema. Pulmonary/Chest: Effort normal and breath sounds normal. No respiratory distress. Abdominal: Soft.  There is no tenderness. Psychiatric: Patient has a normal mood and affect. behavior is normal. Judgment and thought content normal.  PHQ2/9: Depression screen [facility] 2/9 05/22/2017 05/28/2016 02/27/2016 09/20/2015 05/31/2015  Decreased Interest 0 0 0 0 0  Down, Depressed, Hopeless 0 0 0 0 0  PHQ - 2 Score 0 0 0 0 0     Fall Risk: Fall Risk  05/22/2017 05/28/2016 02/27/2016 09/20/2015 05/31/2015  Falls in the past year? No No Yes No No   Number falls in past yr: - - 1 - -  Injury with Fall? - - Yes - -  Comment - - Contusions on her right leg and hip  - -     Functional Status Survey: Is the patient deaf or have difficulty hearing?: No Does the patient have difficulty seeing, even when wearing glasses/contacts?: Yes Does the patient have difficulty concentrating, remembering, or making decisions?: No Does the patient have difficulty walking or climbing stairs?: No Does the patient have difficulty dressing or bathing?: No Does the patient have difficulty doing errands alone such as visiting a doctor's office or shopping?: No   Assessment & Plan   1. Generalized anxiety disorder  - escitalopram (LEXAPRO) 20 MG tablet; Take 1 tablet (  20 mg total) daily by mouth.  Dispense: 90 tablet; Refill: 1 - ALPRAZolam (XANAX) 0.5 MG tablet; Take 1 tablet (0.5 mg total) 2 (two) times daily as needed by mouth.  Dispense: 30 tablet; Refill: 2  2. ASCUS of cervix with negative high risk HPV  Done by gyn back in 03/2017, recheck next year.  3. Depression, major, recurrent, mild (HCC)  - escitalopram (LEXAPRO) 20 MG tablet; Take 1 tablet (20 mg total) daily by mouth.  Dispense: 90 tablet; Refill: 1  4. B12 deficiency  Continue B12 supplementation   5. Vitamin D deficiency  Resume  supplements  6. Dyslipidemia  Taking lovastatin every other day because of cramps, last LDL was very high, she does not want to recheck today   7. Prolactinoma ([place])  Last level was normal, she wants to wait until next year to have it repeated because of cost  8. Irregular periods/menstrual cycles  Peri-menopause  9. Capsulitis of toe, right  She will follow up with Dr. Milinda Pointer next year   10. Back pain with radiculopathy  - predniSONE (STERAPRED UNI-PAK 48 TAB) 10 MG (48) TBPK tablet; Take as directed  Dispense: 48 tablet; Refill: 0

## 2017-08-10 ENCOUNTER — Encounter: Payer: Self-pay | Admitting: Podiatry

## 2017-08-10 ENCOUNTER — Ambulatory Visit (INDEPENDENT_AMBULATORY_CARE_PROVIDER_SITE_OTHER): Payer: BLUE CROSS/BLUE SHIELD | Admitting: Podiatry

## 2017-08-10 ENCOUNTER — Ambulatory Visit (INDEPENDENT_AMBULATORY_CARE_PROVIDER_SITE_OTHER): Payer: BLUE CROSS/BLUE SHIELD

## 2017-08-10 DIAGNOSIS — M779 Enthesopathy, unspecified: Secondary | ICD-10-CM | POA: Diagnosis not present

## 2017-08-10 DIAGNOSIS — M2042 Other hammer toe(s) (acquired), left foot: Secondary | ICD-10-CM | POA: Diagnosis not present

## 2017-08-10 MED ORDER — TRIAMCINOLONE ACETONIDE 10 MG/ML IJ SUSP
10.0000 mg | Freq: Once | INTRAMUSCULAR | Status: AC
  Administered 2017-08-10: 10 mg

## 2017-08-10 NOTE — Progress Notes (Signed)
Subjective:   Patient ID: Cynthia Bean, female   DOB: 52 y.o.   MRN: [id number]   HPI Patient presents stating that she is tried everything over this last 2 years and it continues to be a lot of pain for her and her forefoot left and [place] and is not able to do the work she wants to do.  States that it is worse when she tries to be active   ROS      Objective:  Physical Exam  Neurovascular status intact with patient found to have abnormal positioning of the second digit left with exquisite discomfort in the second and third metatarsal phalangeal joint of the left foot.     Assessment:  Probability for flexor plate injury left with inflammatory capsulitis second MPJ and inflammation of the second and third metatarsal phalangeal joints     Plan:  H&P x-rays reviewed and at this point I have recommended long-term that this may require osteotomy and digital surgery.  Working to try to be conservative with it and I did a proximal block of the left foot I aspirated the second MPJ getting out a small amount of clear fluid and injected with 1/4 cc of dexamethasone Kenalog and applied thick plantar padding.  Reappoint 2 weeks to discuss and see the response to this medication with consideration long-term for surgery  X-ray indicates that there is moderate elongation of the second third metatarsals in comparison the fourth and fifth left with some narrowing of the third metatarsal phalangeal joint and moderate dislocation of the second digit left

## 2017-08-24 ENCOUNTER — Ambulatory Visit: Payer: BLUE CROSS/BLUE SHIELD | Admitting: Podiatry

## 2017-08-26 ENCOUNTER — Ambulatory Visit: Payer: BLUE CROSS/BLUE SHIELD | Admitting: Podiatry

## 2017-08-28 ENCOUNTER — Encounter: Payer: Self-pay | Admitting: Podiatry

## 2017-08-28 ENCOUNTER — Ambulatory Visit (INDEPENDENT_AMBULATORY_CARE_PROVIDER_SITE_OTHER): Payer: BLUE CROSS/BLUE SHIELD | Admitting: Podiatry

## 2017-08-28 DIAGNOSIS — M2042 Other hammer toe(s) (acquired), left foot: Secondary | ICD-10-CM

## 2017-08-28 DIAGNOSIS — M779 Enthesopathy, unspecified: Secondary | ICD-10-CM | POA: Diagnosis not present

## 2017-08-28 DIAGNOSIS — T148XXA Other injury of unspecified body region, initial encounter: Secondary | ICD-10-CM | POA: Diagnosis not present

## 2017-08-28 NOTE — Progress Notes (Signed)
Subjective:   Patient ID: Cynthia Bean, female   DOB: 52 y.o.   MRN: [id number]   HPI Patient states she only had a couple days of relief from the injection and she continues to have chronic pain in her lesser MPJ left second and into the third also there is digital shift of the second toe with possibility for tear   ROS      Objective:  Physical Exam  Inflammatory capsulitis second MPJ left third MPJ left with dorsal and slight medial dislocation second digit     Assessment:  Inflammatory condition with possibility for flexor plate tear or dislocation     Plan:  H&P condition reviewed and recommended at this point that surgical intervention will be necessary with digital fusion and probable metatarsal osteotomy.  We are going to do an MRI first in order to understand whether or not there may be a tear of the flexor plate or other condition going and we will order that and when that is done we will see this patient back in the next 4 weeks for discussion of surgery.  She is putting her mother in a nursing home and that needs to take precedent at this time

## 2017-08-30 ENCOUNTER — Other Ambulatory Visit: Payer: Self-pay | Admitting: Family Medicine

## 2017-08-30 DIAGNOSIS — F411 Generalized anxiety disorder: Secondary | ICD-10-CM

## 2017-08-31 ENCOUNTER — Other Ambulatory Visit: Payer: Self-pay | Admitting: Family Medicine

## 2017-08-31 DIAGNOSIS — F411 Generalized anxiety disorder: Secondary | ICD-10-CM

## 2017-09-01 NOTE — Telephone Encounter (Signed)
Refill request for general medication. Alprazolam to [facility]  Last office visit: 05/22/2018  Follow up on 09/21/2017

## 2017-09-01 NOTE — Telephone Encounter (Signed)
Controlled medication, is she completely out?

## 2017-09-03 NOTE — Telephone Encounter (Signed)
Tried to call patient on her home number but it rings like a fax number. So unable to leave a message.

## 2017-09-11 ENCOUNTER — Telehealth: Payer: BLUE CROSS/BLUE SHIELD | Admitting: Family

## 2017-09-11 ENCOUNTER — Other Ambulatory Visit: Payer: Self-pay | Admitting: Family Medicine

## 2017-09-11 ENCOUNTER — Telehealth: Payer: Self-pay

## 2017-09-11 DIAGNOSIS — B9689 Other specified bacterial agents as the cause of diseases classified elsewhere: Secondary | ICD-10-CM

## 2017-09-11 DIAGNOSIS — J329 Chronic sinusitis, unspecified: Secondary | ICD-10-CM

## 2017-09-11 MED ORDER — AMOXICILLIN 500 MG PO CAPS: 500.0000 mg | ORAL_CAPSULE | Freq: Two times a day (BID) | ORAL | 0 refills | Status: DC

## 2017-09-11 MED ORDER — PREDNISONE 5 MG PO TABS: 5.0000 mg | ORAL_TABLET | ORAL | 0 refills | Status: DC

## 2017-09-11 NOTE — Progress Notes (Signed)
Thank you for the details you included in the comment boxes. Those details are very helpful in determining the best course of treatment for you and help [place] to provide the best care. As we discussed,this Amoxicillin will take care of a sinus infection, but will also take care of strep if you have it. I will send the prednisone pack as we discussed via telephone.   We are sorry that you are not feeling well.  Here is how we plan to help!  Based on what you have shared with me it looks like you have sinusitis; you may also have strep throat.  Sinusitis is inflammation and infection in the sinus cavities of the head.  Based on your presentation I believe you most likely have Acute Bacterial Sinusitis.  This is an infection caused by bacteria and is treated with antibiotics. I have prescribed Amoxicillin 500mg  by mouth twice daily.  You may use an oral decongestant such as Mucinex D or if you have glaucoma or high blood pressure use plain Mucinex. Saline nasal spray help and can safely be used as often as needed for congestion.  If you develop worsening sinus pain, fever or notice severe headache and vision changes, or if symptoms are not better after completion of antibiotic, please schedule an appointment with a health care provider.    Sinus infections are not as easily transmitted as other respiratory infection, however we still recommend that you avoid close contact with loved ones, especially the very young and elderly.  Remember to wash your hands thoroughly throughout the day as this is the number one way to prevent the spread of infection!  Home Care:  Only take medications as instructed by your medical team.  Complete the entire course of an antibiotic.  Do not take these medications with alcohol.  A steam or ultrasonic humidifier can help congestion.  You can place a towel over your head and breathe in the steam from hot water coming from a faucet.  Avoid close contacts especially the very  young and the elderly.  Cover your mouth when you cough or sneeze.  Always remember to wash your hands.  Get Help Right Away If:  You develop worsening fever or sinus pain.  You develop a severe head ache or visual changes.  Your symptoms persist after you have completed your treatment plan.  Make sure you  Understand these instructions.  Will watch your condition.  Will get help right away if you are not doing well or get worse.  Your e-visit answers were reviewed by a board certified advanced clinical practitioner to complete your personal care plan.  Depending on the condition, your plan could have included both over the counter or prescription medications.  If there is a problem please reply  once you have received a response from your provider.  Your safety is important to [place].  If you have drug allergies check your prescription carefully.    You can use MyChart to ask questions about today's visit, request a non-urgent call back, or ask for a work or school excuse for 24 hours related to this e-Visit. If it has been greater than 24 hours you will need to follow up with your provider, or enter a new e-Visit to address those concerns.  You will get an e-mail in the next two days asking about your experience.  I hope that your e-visit has been valuable and will speed your recovery. Thank you for using e-visits.

## 2017-09-11 NOTE — Telephone Encounter (Signed)
Copied from [place] [id number]. Topic: Appointment Scheduling - Same Day Appointment >> Sep 11, 2017 11:51 AM Ether Griffins B wrote: Reason for CRM: possible strep throat. Pt is having to fly out and take her father off of life support

## 2017-09-11 NOTE — Telephone Encounter (Signed)
She did an e-visit and got the rx of penicillin

## 2017-09-21 ENCOUNTER — Ambulatory Visit (INDEPENDENT_AMBULATORY_CARE_PROVIDER_SITE_OTHER): Payer: BLUE CROSS/BLUE SHIELD | Admitting: Family Medicine

## 2017-09-21 ENCOUNTER — Encounter: Payer: Self-pay | Admitting: Family Medicine

## 2017-09-21 VITALS — BP 120/80 | HR 80 | Temp 98.0°F | Resp 16 | Ht 68.0 in | Wt 192.7 lb

## 2017-09-21 DIAGNOSIS — D352 Benign neoplasm of pituitary gland: Secondary | ICD-10-CM | POA: Diagnosis not present

## 2017-09-21 DIAGNOSIS — R739 Hyperglycemia, unspecified: Secondary | ICD-10-CM | POA: Diagnosis not present

## 2017-09-21 DIAGNOSIS — E538 Deficiency of other specified B group vitamins: Secondary | ICD-10-CM | POA: Diagnosis not present

## 2017-09-21 DIAGNOSIS — J029 Acute pharyngitis, unspecified: Secondary | ICD-10-CM | POA: Diagnosis not present

## 2017-09-21 DIAGNOSIS — F330 Major depressive disorder, recurrent, mild: Secondary | ICD-10-CM

## 2017-09-21 DIAGNOSIS — Z79899 Other long term (current) drug therapy: Secondary | ICD-10-CM

## 2017-09-21 DIAGNOSIS — E785 Hyperlipidemia, unspecified: Secondary | ICD-10-CM

## 2017-09-21 DIAGNOSIS — E559 Vitamin D deficiency, unspecified: Secondary | ICD-10-CM | POA: Diagnosis not present

## 2017-09-21 DIAGNOSIS — Z131 Encounter for screening for diabetes mellitus: Secondary | ICD-10-CM

## 2017-09-21 DIAGNOSIS — F411 Generalized anxiety disorder: Secondary | ICD-10-CM

## 2017-09-21 MED ORDER — ALPRAZOLAM 0.5 MG PO TABS: 0.5000 mg | ORAL_TABLET | Freq: Two times a day (BID) | ORAL | 2 refills | Status: DC | PRN

## 2017-09-21 MED ORDER — CO Q 10 100 MG PO CAPS: 1.0000 | ORAL_CAPSULE | Freq: Every day | ORAL | 0 refills | Status: DC

## 2017-09-21 MED ORDER — ATORVASTATIN CALCIUM 40 MG PO TABS
40.0000 mg | ORAL_TABLET | Freq: Every day | ORAL | 3 refills | Status: DC
Start: 2017-09-21 — End: 2017-09-23

## 2017-09-21 MED ORDER — ESCITALOPRAM OXALATE 20 MG PO TABS: 20.0000 mg | ORAL_TABLET | Freq: Every day | ORAL | 1 refills | Status: DC

## 2017-09-21 MED ORDER — LIDOCAINE VISCOUS 2 % MT SOLN: 15.0000 mL | Freq: Four times a day (QID) | OROMUCOSAL | 0 refills | Status: DC | PRN

## 2017-09-21 NOTE — Progress Notes (Signed)
Name: Cynthia Bean   MRN: [id number]    DOB: Apr 28, 1966   Date:09/21/2017       Progress Note  Subjective  Chief Complaint  Chief Complaint  Patient presents with  . Depression  . Medication Refill    HPI  GAD: she states her mind is always going, feels nervous, taking Alprazolam 0.5 mg at mostoncedaily to help with symptoms, she tried Alprazolam XR 1 mg but she did not feel comfortable taking it. Taking Lexapro also, denies side effects of medication. She has been under more stress lately, father died this month, also had to place mother in nursing home.   Major Depression: she is doing well on Lexapro, under more stress but coping well  Hyperlipidemia: taking Mevacor every other day and denies side effects, no chest pain, discussed last labs, LDL was very high and we will recheck it, however advised to change to Atorvastatin   Perimenopause:seen by GYN, had a normal pap, diagnosed with fibroid tumors. Cycles are skipping now.   Hyperglycemia: she denies polyphagia, polydipsia or polyuria  Prolactinoma : diagnosed in 2008 at [facility], she was seeing Oncologist Mamie Levers ) at [facility], took medication for one year - Dostinex , but stopped on her own. Last MRI showed stable to smaller microadenoma. She denies galactorrhea.Last Prolactin was back to normal.   Low back pain with radiculitis: she has been cleaning mother's house and is causing a flare, pain going down right leg  URI: she developed a sore throat ( son had strep), she took antibiotics ( given by e physician), Amoxicillin and also was given prednisone. She is feeling better, however still has some sore and dry cough. She has some chills at night.    Patient Active Problem List   Diagnosis Date Noted  . ASCUS of cervix with negative high risk HPV 04/10/2017  . Abnormal uterine bleeding (AUB) 02/27/2016  . Uterine fibroid 02/27/2016  . Generalized anxiety disorder 09/20/2015  . Allergic rhinitis  05/31/2015  . Depression, major, recurrent, mild ([place]) 05/31/2015  . Dyslipidemia 05/31/2015  . Blood glucose elevated 05/31/2015  . Irritable bowel syndrome with diarrhea 05/31/2015  . Female climacteric state 05/31/2015  . Pituitary adenoma ([place]) 05/31/2015  . Restless legs syndrome 05/31/2015  . B12 deficiency 05/31/2015  . Epicondylitis, lateral (tennis elbow) 05/31/2015  . Fracture, toe 04/13/2013  . Tobacco use 05/03/2009  . Vitamin D deficiency 05/03/2009    Past Surgical History:  Procedure Laterality Date  . TONSILECTOMY, ADENOIDECTOMY, BILATERAL MYRINGOTOMY AND TUBES    . TUBAL LIGATION      Family History  Problem Relation Age of Onset  . Hyperlipidemia Mother   . Hypertension Mother   . Thyroid disease Mother   . Diabetes Father   . Hypertension Father   . CAD Father   . Stroke Father   . ADD / ADHD Son   . Breast cancer Maternal Grandmother     Social History   Socioeconomic History  . Marital status: Married    Spouse name: Not on file  . Number of children: Not on file  . Years of education: Not on file  . Highest education level: Not on file  Social Needs  . Financial resource strain: Not on file  . Food insecurity - worry: Not on file  . Food insecurity - inability: Not on file  . Transportation needs - medical: Not on file  . Transportation needs - non-medical: Not on file  Occupational History  .  Not on file  Tobacco Use  . Smoking status: Former Smoker    Packs/day: 0.50    Years: 30.00    Pack years: 15.00    Types: Cigarettes  . Smokeless tobacco: Former Systems developer    Quit date: 08/06/2016  . Tobacco comment: switched to e-cigarettes  Substance and Sexual Activity  . Alcohol use: Yes    Comment: occasional  . Drug use: No  . Sexual activity: Yes    Partners: Male  Other Topics Concern  . Not on file  Social History Narrative   Married     Current Outpatient Medications:  .  ALPRAZolam (XANAX) 0.5 MG tablet, Take 1 tablet (0.5 mg  total) by mouth 2 (two) times daily as needed., Disp: 30 tablet, Rfl: 2 .  Cyanocobalamin (B-12) 1000 MCG SUBL, Place 1 tablet under the tongue every other day. , Disp: , Rfl:  .  escitalopram (LEXAPRO) 20 MG tablet, Take 1 tablet (20 mg total) by mouth daily., Disp: 90 tablet, Rfl: 1 .  atorvastatin (LIPITOR) 40 MG tablet, Take 1 tablet (40 mg total) by mouth daily., Disp: 90 tablet, Rfl: 3 .  Coenzyme Q10 (CO Q 10) 100 MG CAPS, Take 1 capsule by mouth daily., Disp: 30 capsule, Rfl: 0  Allergies  Allergen Reactions  . Biaxin  [Clarithromycin] Other (See Comments)  . Bupropion Hcl Diarrhea  . Darvocet [Propoxyphene N-Acetaminophen] Nausea Only     ROS  Constitutional: Negative for fever or weight change.  Respiratory: positive  for dry cough but no  shortness of breath.   Cardiovascular: Negative for chest pain or palpitations.  Gastrointestinal: Negative for abdominal pain, no bowel changes.  Musculoskeletal: Negative for gait problem or joint swelling.  Skin: Negative for rash.  Neurological: Negative for dizziness or headache.  No other specific complaints in a complete review of systems (except as listed in HPI above).   Objective  Vitals:   09/21/17 1307  BP: 120/80  Pulse: 80  Resp: 16  Temp: 98 F (36.7 C)  TempSrc: Oral  SpO2: 96%  Weight: 192 lb 11.2 oz (87.4 kg)  Height: 5\' 8"  (1.727 m)    Body mass index is 29.3 kg/m.  Physical Exam  Constitutional: Patient appears well-developed and well-nourished. Obese No distress.  HEENT: head atraumatic, normocephalic, pupils equal and reactive to light, neck supple, throat within normal limits Cardiovascular: Normal rate, regular rhythm and normal heart sounds.  No murmur heard. No BLE edema. Pulmonary/Chest: Effort normal and breath sounds normal. No respiratory distress. Abdominal: Soft.  There is no tenderness. Psychiatric: Patient has a normal mood and affect. behavior is normal. Judgment and thought content  normal.  PHQ2/9: Depression screen [facility] 2/9 05/22/2017 05/28/2016 02/27/2016 09/20/2015 05/31/2015  Decreased Interest 0 0 0 0 0  Down, Depressed, Hopeless 0 0 0 0 0  PHQ - 2 Score 0 0 0 0 0     Fall Risk: Fall Risk  05/22/2017 05/28/2016 02/27/2016 09/20/2015 05/31/2015  Falls in the past year? No No Yes No No  Number falls in past yr: - - 1 - -  Injury with Fall? - - Yes - -  Comment - - Contusions on her right leg and hip  - -      Assessment & Plan  1. Depression, major, recurrent, mild (HCC)  - escitalopram (LEXAPRO) 20 MG tablet; Take 1 tablet (20 mg total) by mouth daily.  Dispense: 90 tablet; Refill: 1  2. Prolactinoma ([place])  - Prolactin  3.  Generalized anxiety disorder  - ALPRAZolam (XANAX) 0.5 MG tablet; Take 1 tablet (0.5 mg total) by mouth 2 (two) times daily as needed.  Dispense: 30 tablet; Refill: 2 - escitalopram (LEXAPRO) 20 MG tablet; Take 1 tablet (20 mg total) by mouth daily.  Dispense: 90 tablet; Refill: 1  4. B12 deficiency  - Vitamin B12  5. Vitamin D deficiency  - VITAMIN D 25 Hydroxy (Vit-D Deficiency, Fractures)  6. Dyslipidemia  - Lipid panel - atorvastatin (LIPITOR) 40 MG tablet; Take 1 tablet (40 mg total) by mouth daily.  Dispense: 90 tablet; Refill: 3 - Coenzyme Q10 (CO Q 10) 100 MG CAPS; Take 1 capsule by mouth daily.  Dispense: 30 capsule; Refill: 0  7. Long-term use of high-risk medication  - COMPLETE METABOLIC PANEL WITH GFR - CBC with Differential/Platelet  8. Diabetes mellitus screening  - Hemoglobin A1c  9. Blood glucose elevated   10. Depression, major, recurrent, mild (HCC)  - escitalopram (LEXAPRO) 20 MG tablet; Take 1 tablet (20 mg total) by mouth daily.  Dispense: 90 tablet; Refill: 1  11. Sore throat  Gave reassurance, gargle with warm salted water, normal exam today

## 2017-09-22 ENCOUNTER — Encounter: Payer: Self-pay | Admitting: Family Medicine

## 2017-09-22 LAB — CBC WITH DIFFERENTIAL/PLATELET
Basophils Absolute: 50 cells/uL (ref 0–200)
Basophils Relative: 0.6 %
EOS ABS: 67 {cells}/uL (ref 15–500)
EOS PCT: 0.8 %
HCT: 38.2 % (ref 35.0–45.0)
HEMOGLOBIN: 13.3 g/dL (ref 11.7–15.5)
Lymphs Abs: 2268 cells/uL (ref 850–3900)
MCH: 32.4 pg (ref 27.0–33.0)
MCHC: 34.8 g/dL (ref 32.0–36.0)
MCV: 92.9 fL (ref 80.0–100.0)
MONOS PCT: 9.2 %
MPV: 9.8 fL (ref 7.5–12.5)
NEUTROS ABS: 5242 {cells}/uL (ref 1500–7800)
Neutrophils Relative %: 62.4 %
Platelets: 332 10*3/uL (ref 140–400)
RBC: 4.11 10*6/uL (ref 3.80–5.10)
RDW: 12 % (ref 11.0–15.0)
Total Lymphocyte: 27 %
WBC mixed population: 773 cells/uL (ref 200–950)
WBC: 8.4 10*3/uL (ref 3.8–10.8)

## 2017-09-22 LAB — PROLACTIN: PROLACTIN: 15.2 ng/mL

## 2017-09-22 LAB — COMPLETE METABOLIC PANEL WITH GFR
AG Ratio: 1.9 (calc) (ref 1.0–2.5)
ALKALINE PHOSPHATASE (APISO): 78 U/L (ref 33–130)
ALT: 10 U/L (ref 6–29)
AST: 14 U/L (ref 10–35)
Albumin: 4.3 g/dL (ref 3.6–5.1)
BUN: 7 mg/dL (ref 7–25)
CALCIUM: 9.1 mg/dL (ref 8.6–10.4)
CO2: 29 mmol/L (ref 20–32)
Chloride: 104 mmol/L (ref 98–110)
Creat: 0.87 mg/dL (ref 0.50–1.05)
GFR, EST NON AFRICAN AMERICAN: 77 mL/min/{1.73_m2} (ref >=60)
GFR, Est African American: 89 mL/min/{1.73_m2} (ref >=60)
GLUCOSE: 77 mg/dL (ref 65–139)
Globulin: 2.3 g/dL (calc) (ref 1.9–3.7)
Potassium: 4 mmol/L (ref 3.5–5.3)
Sodium: 138 mmol/L (ref 135–146)
Total Bilirubin: 0.4 mg/dL (ref 0.2–1.2)
Total Protein: 6.6 g/dL (ref 6.1–8.1)

## 2017-09-22 LAB — HEMOGLOBIN A1C
EAG (MMOL/L): 5.7 (calc)
HEMOGLOBIN A1C: 5.2 %{Hb} (ref <5.7)
MEAN PLASMA GLUCOSE: 103 (calc)

## 2017-09-22 LAB — LIPID PANEL
Cholesterol: 157 mg/dL (ref <200)
HDL: 53 mg/dL (ref >50)
LDL Cholesterol (Calc): 87 mg/dL (calc)
Non-HDL Cholesterol (Calc): 104 mg/dL (calc) (ref <130)
TRIGLYCERIDES: 80 mg/dL (ref <150)
Total CHOL/HDL Ratio: 3 (calc) (ref <5.0)

## 2017-09-23 ENCOUNTER — Other Ambulatory Visit: Payer: Self-pay | Admitting: Family Medicine

## 2017-09-23 DIAGNOSIS — E785 Hyperlipidemia, unspecified: Secondary | ICD-10-CM

## 2017-09-23 MED ORDER — LOVASTATIN 20 MG PO TABS: ORAL_TABLET | ORAL | 1 refills | Status: DC

## 2017-09-28 ENCOUNTER — Ambulatory Visit: Payer: BLUE CROSS/BLUE SHIELD | Admitting: Podiatry

## 2017-10-02 ENCOUNTER — Encounter: Payer: Self-pay | Admitting: Family Medicine

## 2017-10-02 ENCOUNTER — Ambulatory Visit (INDEPENDENT_AMBULATORY_CARE_PROVIDER_SITE_OTHER): Payer: BLUE CROSS/BLUE SHIELD | Admitting: Family Medicine

## 2017-10-02 VITALS — BP 110/70 | HR 87 | Temp 97.8°F | Resp 16 | Ht 68.0 in | Wt 195.2 lb

## 2017-10-02 DIAGNOSIS — H6983 Other specified disorders of Eustachian tube, bilateral: Secondary | ICD-10-CM | POA: Diagnosis not present

## 2017-10-02 DIAGNOSIS — R0789 Other chest pain: Secondary | ICD-10-CM

## 2017-10-02 DIAGNOSIS — R070 Pain in throat: Secondary | ICD-10-CM | POA: Diagnosis not present

## 2017-10-02 DIAGNOSIS — R059 Cough, unspecified: Secondary | ICD-10-CM

## 2017-10-02 DIAGNOSIS — R05 Cough: Secondary | ICD-10-CM | POA: Diagnosis not present

## 2017-10-02 MED ORDER — MAGIC MOUTHWASH W/LIDOCAINE: 5.0000 mL | Freq: Three times a day (TID) | ORAL | 0 refills | Status: DC | PRN

## 2017-10-02 NOTE — Addendum Note (Signed)
Addended by: Raelyn Ensign E on: 10/02/2017 05:00 PM   Modules accepted: Level of Service

## 2017-10-02 NOTE — Progress Notes (Signed)
Name: Cynthia Bean   MRN: [id number]    DOB: [birth year]   Date:10/02/2017       Progress Note  Subjective  Chief Complaint  Chief Complaint  Patient presents with  . Cough    scratchy throat, feels like something is torn  . Fatigue    weak, nausea, dizzy at times  . Ear Pain    for 1 month    HPI   Sore Throat/Throat Pain: - Pt presents with concern for sore throat that has been ongoing for 1 month.  She endorses bilateral otalgia and discomfort (started about 3 weeks ago), some dizziness (occurs randomly with certain head movements, started about 2 weeks ago), dry cough (worse at night).  She was treated for Sinusitis on E-visit on 09/11/2017 with amoxicillin and prednisone which she completed.  Was seen 09/21/2017 by PCP Dr. Ancil Boozer who provided reassurance and recommended warm salt water gargles as exam was WNL.  - Her most concerning symptom is a sharp pain in her LEFT side of her throat when she yells or laughs - anything that strains her voice or if she moves a certain way.  This pain is sharp  She is s/p tonsillectomy.  She has tried to schedule with an ENT herself, but was unable to get an appointment until 10/12/2016 - She is scheduled with [facility].   Chest pain: She has noticed new onset chest pain intermittent for about 1.5 weeks. Chest pain is non-radiating and in the RIGHT side of her chest, does not seem to have a pattern, and lasts for only a few seconds.  She has well controlled hyperlipidemia - on lovastatin every other day and very compliant, recent lipid panel reviewed. She has a history of reflux, but does not think that is what is causing her symptoms. Does not want to try Zantac at this time. - Grief/Depression - She has been under a lot of stress lately - her mom recently moved into a nursing home and her father recently had a stroke and passed away, she had to fly to [place] in early March to help move out of her parents home. - Denies shortness of breath,  palpitations, swelling, vision changes, slurred speech, confusion, difficulty walking, extremity weakness/numbness/tingling.  Patient Active Problem List   Diagnosis Date Noted  . ASCUS of cervix with negative high risk HPV 04/10/2017  . Abnormal uterine bleeding (AUB) 02/27/2016  . Uterine fibroid 02/27/2016  . Generalized anxiety disorder 09/20/2015  . Allergic rhinitis 05/31/2015  . Depression, major, recurrent, mild ([place]) 05/31/2015  . Dyslipidemia 05/31/2015  . Blood glucose elevated 05/31/2015  . Irritable bowel syndrome with diarrhea 05/31/2015  . Female climacteric state 05/31/2015  . Pituitary adenoma ([place]) 05/31/2015  . Restless legs syndrome 05/31/2015  . B12 deficiency 05/31/2015  . Epicondylitis, lateral (tennis elbow) 05/31/2015  . Fracture, toe 04/13/2013  . Tobacco use 05/03/2009  . Vitamin D deficiency 05/03/2009    Social History   Tobacco Use  . Smoking status: Former Smoker    Packs/day: 0.50    Years: 30.00    Pack years: 15.00    Types: Cigarettes  . Smokeless tobacco: Former Systems developer    Quit date: 08/06/2016  . Tobacco comment: switched to e-cigarettes  Substance Use Topics  . Alcohol use: Yes    Comment: occasional     Current Outpatient Medications:  .  ALPRAZolam (XANAX) 0.5 MG tablet, Take 1 tablet (0.5 mg total) by mouth 2 (two) times daily as needed., Disp:  30 tablet, Rfl: 2 .  Coenzyme Q10 (CO Q 10) 100 MG CAPS, Take 1 capsule by mouth daily., Disp: 30 capsule, Rfl: 0 .  Cyanocobalamin (B-12) 1000 MCG SUBL, Place 1 tablet under the tongue every other day. , Disp: , Rfl:  .  escitalopram (LEXAPRO) 20 MG tablet, Take 1 tablet (20 mg total) by mouth daily., Disp: 90 tablet, Rfl: 1 .  lovastatin (MEVACOR) 20 MG tablet, TAKE 1 TABLET BY MOUTH EVERY DAY FOR CHOLESTEROL, Disp: 90 tablet, Rfl: 1 .  lidocaine (XYLOCAINE) 2 % solution, Use as directed 15 mLs in the mouth or throat every 6 (six) hours as needed for mouth pain. (Patient not taking: Reported  on 10/02/2017), Disp: 100 mL, Rfl: 0  Allergies  Allergen Reactions  . Biaxin  [Clarithromycin] Other (See Comments)  . Bupropion Hcl Diarrhea  . Darvocet [Propoxyphene N-Acetaminophen] Nausea Only    ROS  Ten systems reviewed and is negative except as mentioned in HPI.  Objective  Vitals:   10/02/17 1106  BP: 110/70  Pulse: 87  Resp: 16  Temp: 97.8 F (36.6 C)  TempSrc: Oral  SpO2: 93%  Weight: 195 lb 3.2 oz (88.5 kg)  Height: 5\' 8"  (1.727 m)   Body mass index is 29.68 kg/m.  Nursing Note and Vital Signs reviewed.  Physical Exam  Constitutional: Patient appears well-developed and well-nourished. Overweight.  No distress.  HEENT: head atraumatic, normocephalic, pupils equal and reactive to light, EOM's intact, TM's without erythema or bulging, no maxillary or frontal sinus tenderness, neck supple without lymphadenopathy, oropharynx mildly erythematous and moist without exudate or edema. S/p tonsillectomy Cardiovascular: Normal rate, regular rhythm, S1/S2 present.  No murmur or rub heard. No BLE edema. Some mild tenderness over the right side of chest on palpation - recently did a lot of heavy lifting while moving things from her parent's home. Pulmonary/Chest: Effort normal and breath sounds clear. No respiratory distress or retractions. Psychiatric: Patient has a normal mood and affect. behavior is normal. Judgment and thought content normal. Neurological: she is alert and oriented to person, place, and time. No cranial nerve deficit. Coordination, balance, strength, speech and gait are normal.  Skin: Skin is warm and dry. No rash noted. No erythema.    No results found for this or any previous visit (from the past 72 hour(s)).  Assessment & Plan  1. Throat pain - magic mouthwash w/lidocaine SOLN; Take 5 mLs by mouth 3 (three) times daily as needed for mouth pain.  Dispense: 100 mL; Refill: 0 - She already has ENT appointment for April 1, discussed with referrals  coordinator, and pt will likely not get an appointment any sooner with referral, so we will hold on this today.  I recommend she practice as much vocal rest as possible, warm salt water gargles, and magic mouthwash as above. - She has already been treated with amoxicillin and prednisone, so we will hold on additional testing/empiric treatment and defer to ENT at this time.  2. Chest discomfort - EKG 12-Lead - EKG: normal EKG, normal sinus rhythm. - Mild tenderness over right chest on palptation - question if this is partly MSK in nature.  Pt also under significant amount of stress with the passing of her father and recent moving of her mother to nursing facility.  She is tearful discussing this in office today.  Question if pain could be anxiety related. Advised if CP changes in any way/does not dissipate after a few seconds, she should present for emergency  care.  3. Dysfunction of both eustachian tubes - Declines Rx for flonase today; advised to start using OTC and she is in agreement. - Neurologic exam is normal today.  4. Cough - Declines tessalon for cough.  Declines H2-Blocker/PPI trial to see if related to reflux.  Will await ENT appointment for further evaluation.   -Red flags and when to present for emergency care or RTC including fever >101.40F, chest pain that does not go away, shortness of breath, hemoptysis new/worsening/un-resolving symptoms, reviewed with patient at time of visit. Follow up and care instructions discussed and provided in AVS. - Discussed case with PCP Dr. Ancil Boozer who is in agreement with plan of care.

## 2017-10-02 NOTE — Patient Instructions (Signed)
-   Take Flonase daily - Present to ER if chest pain lasts longer than a few seconds and/or changes in any way from the way that it has been presenting. - Keep appointment with ENT.

## 2017-10-14 ENCOUNTER — Encounter: Payer: Self-pay | Admitting: Family Medicine

## 2017-11-15 ENCOUNTER — Telehealth: Payer: BLUE CROSS/BLUE SHIELD | Admitting: Physician Assistant

## 2017-11-15 DIAGNOSIS — B9689 Other specified bacterial agents as the cause of diseases classified elsewhere: Secondary | ICD-10-CM

## 2017-11-15 DIAGNOSIS — J019 Acute sinusitis, unspecified: Secondary | ICD-10-CM

## 2017-11-15 MED ORDER — AMOXICILLIN-POT CLAVULANATE 875-125 MG PO TABS: 1.0000 | ORAL_TABLET | Freq: Two times a day (BID) | ORAL | 0 refills | Status: DC

## 2017-11-15 NOTE — Progress Notes (Signed)

## 2018-01-01 ENCOUNTER — Telehealth: Payer: Self-pay | Admitting: Podiatry

## 2018-01-01 NOTE — Telephone Encounter (Signed)
I saw Dr. Paulla Dolly a few months ago and I was supposed to get an ultrasound of my left foot. He had something about doing a surgery on it. I wanted to go ahead and get that ultrasound scheduled. I never heard from anybody and I just kind of let it go. If you could call me back at [phone] that would be great. Thank you very much. Bye bye.

## 2018-01-01 NOTE — Telephone Encounter (Signed)
Left message informing pt the orders of 08/2017 and would need to be prior authorized.

## 2018-01-12 ENCOUNTER — Telehealth: Payer: Self-pay | Admitting: Podiatry

## 2018-01-12 DIAGNOSIS — M2042 Other hammer toe(s) (acquired), left foot: Secondary | ICD-10-CM

## 2018-01-12 DIAGNOSIS — M779 Enthesopathy, unspecified: Secondary | ICD-10-CM

## 2018-01-12 DIAGNOSIS — T148XXA Other injury of unspecified body region, initial encounter: Secondary | ICD-10-CM

## 2018-01-12 NOTE — Addendum Note (Signed)
Addended by: Harriett Sine D on: 01/12/2018 01:12 PM   Modules accepted: Orders

## 2018-01-12 NOTE — Telephone Encounter (Signed)
Patient would like the name of the place she's getting her MRI done at and also would like the phone number. Please call patient back at [phone]

## 2018-01-12 NOTE — Telephone Encounter (Addendum)
Pt states she would like to have the MRI in the [facility]. Faxed orders to [facility]. Faxed orders to [facility].

## 2018-01-12 NOTE — Telephone Encounter (Signed)
Left message for pt to call with preferred location for MRI.

## 2018-01-18 ENCOUNTER — Encounter: Payer: Self-pay | Admitting: Family Medicine

## 2018-01-18 ENCOUNTER — Ambulatory Visit (INDEPENDENT_AMBULATORY_CARE_PROVIDER_SITE_OTHER): Payer: BLUE CROSS/BLUE SHIELD | Admitting: Family Medicine

## 2018-01-18 VITALS — BP 110/68 | HR 78 | Temp 97.7°F | Resp 16 | Ht 68.0 in | Wt 190.5 lb

## 2018-01-18 DIAGNOSIS — D352 Benign neoplasm of pituitary gland: Secondary | ICD-10-CM

## 2018-01-18 DIAGNOSIS — Z1231 Encounter for screening mammogram for malignant neoplasm of breast: Secondary | ICD-10-CM

## 2018-01-18 DIAGNOSIS — R3989 Other symptoms and signs involving the genitourinary system: Secondary | ICD-10-CM

## 2018-01-18 DIAGNOSIS — F330 Major depressive disorder, recurrent, mild: Secondary | ICD-10-CM | POA: Diagnosis not present

## 2018-01-18 DIAGNOSIS — Z1239 Encounter for other screening for malignant neoplasm of breast: Secondary | ICD-10-CM

## 2018-01-18 DIAGNOSIS — E785 Hyperlipidemia, unspecified: Secondary | ICD-10-CM | POA: Diagnosis not present

## 2018-01-18 DIAGNOSIS — M5442 Lumbago with sciatica, left side: Secondary | ICD-10-CM | POA: Diagnosis not present

## 2018-01-18 DIAGNOSIS — F411 Generalized anxiety disorder: Secondary | ICD-10-CM | POA: Diagnosis not present

## 2018-01-18 DIAGNOSIS — N3001 Acute cystitis with hematuria: Secondary | ICD-10-CM

## 2018-01-18 LAB — POCT URINALYSIS DIPSTICK
BILIRUBIN UA: NEGATIVE
Glucose, UA: NEGATIVE
KETONES UA: NEGATIVE
NITRITE UA: NEGATIVE
PH UA: 5 (ref 5.0–8.0)
PROTEIN UA: NEGATIVE
SPEC GRAV UA: 1.02 (ref 1.010–1.025)
UROBILINOGEN UA: 0.2 U/dL

## 2018-01-18 MED ORDER — PREDNISONE 10 MG (48) PO TBPK: ORAL_TABLET | ORAL | 0 refills | Status: DC

## 2018-01-18 MED ORDER — HYDROXYZINE HCL 25 MG PO TABS: 25.0000 mg | ORAL_TABLET | Freq: Three times a day (TID) | ORAL | 2 refills | Status: DC | PRN

## 2018-01-18 MED ORDER — CIPROFLOXACIN HCL 250 MG PO TABS
250.0000 mg | ORAL_TABLET | Freq: Two times a day (BID) | ORAL | 0 refills | Status: DC
Start: 2018-01-18 — End: 2018-04-19

## 2018-01-18 MED ORDER — ESCITALOPRAM OXALATE 20 MG PO TABS: 20.0000 mg | ORAL_TABLET | Freq: Every day | ORAL | 1 refills | Status: DC

## 2018-01-18 MED ORDER — LOVASTATIN 20 MG PO TABS: ORAL_TABLET | ORAL | 1 refills | Status: DC

## 2018-01-18 MED ORDER — ALPRAZOLAM 0.5 MG PO TABS: 0.5000 mg | ORAL_TABLET | Freq: Every day | ORAL | 2 refills | Status: DC | PRN

## 2018-01-18 NOTE — Progress Notes (Signed)
Name: Cynthia Bean   MRN: [id number]    DOB: 08/30/1965   Date:01/18/2018       Progress Note  Subjective  Chief Complaint  Chief Complaint  Patient presents with  . Medication Refill    xanax   . Depression    stable  . Anxiety    sxs are manageble with medication.   . Hyperlipidemia    patient has some swelling behind her knee   . Hematuria    After lifting heavy objects and pressure in bladder region-would like urine checked.    HPI  GAD: she states her mind is always going, feels nervous, taking Alprazolam 0.5 mg at mostoncedaily to help with symptoms, she tried Alprazolam XR 1 mg but she did not feel comfortable taking it. Taking Lexapro also, denies side effects of medication. Explained risk of long term use of BZD and also FDA changes. We will try hydroxyzine to see if she can wean self off alprazolam   Major Depression: she is doing well on Lexapro, phq 9 is lower than last time. No suicidal thoughts or ideation.   Hyperlipidemia: taking lovastatin only three times weekly and last LDL was at goal at 87. HDL has also improved. Continue life style modification   Perimenopause:seen by GYN, had a normal pap, diagnosed with fibroid tumors. Last pap smear done by gyn and showed ASCUS   Hyperglycemia: she denies polyphagia, polydipsia or polyuria, last glucose at goal. Denies polyphagia, polyuria or polydipsia   Prolactinoma : diagnosed in 2008 at [facility], she was seeing Oncologist Mamie Levers ) at [facility], took medication for one year - Dostinex , but stopped on her own. Last MRI showed stable to smaller microadenoma. She denies galactorrhea. Reviewed last labs. No headaches or double vision    Low back pain with radiculitis: she states a few months ago she has noticed lower left back pain radiating down left leg, worse at night, goes all the way down to left foot at times. She has also noticed intermittent swelling on left popliteal fossa and limps because of  pain intermittently. Shifting positions helps with pain, worse when still or when standing for prolonged period of time  Supra pubic pressure: going on for intermittently for a long time ( cannot tell me how long) , she has a history of hematuria, no fever or chills. She denies dysuria.    Patient Active Problem List   Diagnosis Date Noted  . ASCUS of cervix with negative high risk HPV 04/10/2017  . Abnormal uterine bleeding (AUB) 02/27/2016  . Uterine fibroid 02/27/2016  . Generalized anxiety disorder 09/20/2015  . Allergic rhinitis 05/31/2015  . Depression, major, recurrent, mild ([place]) 05/31/2015  . Dyslipidemia 05/31/2015  . Blood glucose elevated 05/31/2015  . Irritable bowel syndrome with diarrhea 05/31/2015  . Female climacteric state 05/31/2015  . Pituitary adenoma ([place]) 05/31/2015  . Restless legs syndrome 05/31/2015  . B12 deficiency 05/31/2015  . Epicondylitis, lateral (tennis elbow) 05/31/2015  . Fracture, toe 04/13/2013  . Tobacco use 05/03/2009  . Vitamin D deficiency 05/03/2009    Past Surgical History:  Procedure Laterality Date  . TONSILECTOMY, ADENOIDECTOMY, BILATERAL MYRINGOTOMY AND TUBES    . TUBAL LIGATION      Family History  Problem Relation Age of Onset  . Hyperlipidemia Mother   . Hypertension Mother   . Thyroid disease Mother   . Diabetes Father   . Hypertension Father   . CAD Father   . Stroke Father   .  ADD / ADHD Son   . Breast cancer Maternal Grandmother     Social History   Socioeconomic History  . Marital status: Married    Spouse name: Fritz Pickerel  . Number of children: 2  . Years of education: Not on file  . Highest education level: Some college, no degree  Occupational History  . Not on file  Social Needs  . Financial resource strain: Not hard at all  . Food insecurity:    Worry: Never true    Inability: Never true  . Transportation needs:    Medical: No    Non-medical: No  Tobacco Use  . Smoking status: Former Smoker     Packs/day: 0.50    Years: 30.00    Pack years: 15.00    Types: Cigarettes  . Smokeless tobacco: Former Systems developer    Quit date: 08/06/2016  . Tobacco comment: switched to e-cigarettes  Substance and Sexual Activity  . Alcohol use: Yes    Comment: occasional  . Drug use: No  . Sexual activity: Yes    Partners: Male  Lifestyle  . Physical activity:    Days per week: 7 days    Minutes per session: 30 min  . Stress: To some extent  Relationships  . Social connections:    Talks on phone: More than three times a week    Gets together: More than three times a week    Attends religious service: Never    Active member of club or organization: No    Attends meetings of clubs or organizations: Never    Relationship status: Married  . Intimate partner violence:    Fear of current or ex partner: No    Emotionally abused: No    Physically abused: No    Forced sexual activity: No  Other Topics Concern  . Not on file  Social History Narrative   Married     Current Outpatient Medications:  .  ALPRAZolam (XANAX) 0.5 MG tablet, Take 1 tablet (0.5 mg total) by mouth daily as needed., Disp: 30 tablet, Rfl: 2 .  Cyanocobalamin (B-12) 1000 MCG SUBL, Place 1 tablet under the tongue every other day. , Disp: , Rfl:  .  escitalopram (LEXAPRO) 20 MG tablet, Take 1 tablet (20 mg total) by mouth daily., Disp: 90 tablet, Rfl: 1 .  lovastatin (MEVACOR) 20 MG tablet, TAKE 1 TABLET BY MOUTH EVERY DAY FOR CHOLESTEROL, Disp: 90 tablet, Rfl: 1 .  ciprofloxacin (CIPRO) 250 MG tablet, Take 1 tablet (250 mg total) by mouth 2 (two) times daily., Disp: 6 tablet, Rfl: 0 .  Coenzyme Q10 (CO Q 10) 100 MG CAPS, Take 1 capsule by mouth daily. (Patient not taking: Reported on 01/18/2018), Disp: 30 capsule, Rfl: 0 .  hydrOXYzine (ATARAX/VISTARIL) 25 MG tablet, Take 1 tablet (25 mg total) by mouth 3 (three) times daily as needed., Disp: 30 tablet, Rfl: 2 .  predniSONE (STERAPRED UNI-PAK 48 TAB) 10 MG (48) TBPK tablet, Take as  directed, Disp: 48 tablet, Rfl: 0  Allergies  Allergen Reactions  . Biaxin  [Clarithromycin] Other (See Comments)  . Bupropion Hcl Diarrhea  . Darvocet [Propoxyphene N-Acetaminophen] Nausea Only     ROS  Constitutional: Negative for fever or weight change.  Respiratory: Negative for cough and shortness of breath.   Cardiovascular: Negative for chest pain or palpitations.  Gastrointestinal: positive for supra pubic pain intermittently, no bowel changes.  Musculoskeletal: positive  for gait problem and intermittent  joint swelling ( left popliteal fossa) .  Skin: Negative for rash.  Neurological: Negative for dizziness or headache.  No other specific complaints in a complete review of systems (except as listed in HPI above).  Objective  Vitals:   01/18/18 1422  BP: 110/68  Pulse: 78  Resp: 16  Temp: 97.7 F (36.5 C)  TempSrc: Oral  SpO2: 98%  Weight: 190 lb 8 oz (86.4 kg)  Height: 5\' 8"  (1.727 m)    Body mass index is 28.97 kg/m.  Physical Exam  Constitutional: Patient appears well-developed and well-nourished. Overweight.  No distress.  HEENT: head atraumatic, normocephalic, pupils equal and reactive to light,  neck supple, throat within normal limits Cardiovascular: Normal rate, regular rhythm and normal heart sounds.  No murmur heard. No BLE edema. Pulmonary/Chest: Effort normal and breath sounds normal. No respiratory distress. Abdominal: Soft.  There is no tenderness. Psychiatric: Patient has a normal mood and affect. behavior is normal. Judgment and thought content normal.  Recent Results (from the past 2160 hour(s))  POCT urinalysis dipstick     Status: Abnormal   Collection Time: 01/18/18  2:23 PM  Result Value Ref Range   Color, UA yellow    Clarity, UA clear    Glucose, UA Negative Negative   Bilirubin, UA neg    Ketones, UA neg    Spec Grav, UA 1.020 1.010 - 1.025   Blood, UA Moderate    pH, UA 5.0 5.0 - 8.0   Protein, UA Negative Negative    Urobilinogen, UA 0.2 0.2 or 1.0 E.U./dL   Nitrite, UA negative    Leukocytes, UA Large (3+) (A) Negative   Appearance     Odor        PHQ2/9: Depression screen [facility] 2/9 01/18/2018 09/21/2017 05/22/2017 05/28/2016 02/27/2016  Decreased Interest 0 1 0 0 0  Down, Depressed, Hopeless 0 1 0 0 0  PHQ - 2 Score 0 2 0 0 0  Altered sleeping 3 2 - - -  Tired, decreased energy 0 1 - - -  Change in appetite 0 2 - - -  Feeling bad or failure about yourself  1 0 - - -  Trouble concentrating 0 1 - - -  Moving slowly or fidgety/restless 0 1 - - -  Suicidal thoughts 0 0 - - -  PHQ-9 Score 4 9 - - -  Difficult doing work/chores - Somewhat difficult - - -     Fall Risk: Fall Risk  05/22/2017 05/28/2016 02/27/2016 09/20/2015 05/31/2015  Falls in the past year? No No Yes No No  Number falls in past yr: - - 1 - -  Injury with Fall? - - Yes - -  Comment - - Contusions on her right leg and hip  - -   GAD 7 : Generalized Anxiety Score 01/18/2018  Nervous, Anxious, on Edge 3  Control/stop worrying 0  Worry too much - different things 1  Trouble relaxing 3  Restless 3  Easily annoyed or irritable 1  Afraid - awful might happen 3  Total GAD 7 Score 14  Anxiety Difficulty Very difficult      Assessment & Plan  1. Hematuria due to acute cystitis  - POCT urinalysis dipstick - CULTURE, URINE COMPREHENSIVE - ciprofloxacin (CIPRO) 250 MG tablet; Take 1 tablet (250 mg total) by mouth 2 (two) times daily.  Dispense: 6 tablet; Refill: 0  2. Sensation of pressure in bladder area  - POCT urinalysis dipstick  3. Depression, major, recurrent, mild ([facility])  - escitalopram (LEXAPRO) 20  MG tablet; Take 1 tablet (20 mg total) by mouth daily.  Dispense: 90 tablet; Refill: 1  4. Prolactinoma ([place])   5. Acute left-sided low back pain with left-sided sciatica  - predniSONE (STERAPRED UNI-PAK 48 TAB) 10 MG (48) TBPK tablet; Take as directed  Dispense: 48 tablet; Refill: 0  6. Breast cancer screening  - MM  DIGITAL SCREENING BILATERAL; Future  7. Generalized anxiety disorder  Discussed importance of weaning off alprazolam, try to take hydroxyzine instead.  - escitalopram (LEXAPRO) 20 MG tablet; Take 1 tablet (20 mg total) by mouth daily.  Dispense: 90 tablet; Refill: 1 - ALPRAZolam (XANAX) 0.5 MG tablet; Take 1 tablet (0.5 mg total) by mouth daily as needed.  Dispense: 30 tablet; Refill: 2 - hydrOXYzine (ATARAX/VISTARIL) 25 MG tablet; Take 1 tablet (25 mg total) by mouth 3 (three) times daily as needed.  Dispense: 30 tablet; Refill: 2  8. Dyslipidemia  - lovastatin (MEVACOR) 20 MG tablet; TAKE 1 TABLET BY MOUTH EVERY DAY FOR CHOLESTEROL  Dispense: 90 tablet; Refill: 1

## 2018-01-19 ENCOUNTER — Encounter: Payer: Self-pay | Admitting: Family Medicine

## 2018-01-21 ENCOUNTER — Telehealth: Payer: Self-pay | Admitting: *Deleted

## 2018-01-21 DIAGNOSIS — M2042 Other hammer toe(s) (acquired), left foot: Secondary | ICD-10-CM

## 2018-01-21 DIAGNOSIS — T148XXA Other injury of unspecified body region, initial encounter: Secondary | ICD-10-CM

## 2018-01-21 DIAGNOSIS — M779 Enthesopathy, unspecified: Secondary | ICD-10-CM

## 2018-01-21 NOTE — Telephone Encounter (Signed)
Ernestine Conrad states fax orders to [phone] Attn: Lattie Haw in [place]. Orders faxed to [facility] Attn: Lisa in [place], with note to fax results to [phone].

## 2018-01-21 NOTE — Telephone Encounter (Signed)
Pt states she would like to be scheduled with [facility], because her insurance states it would cost her $800.00 rather than $2300.00.

## 2018-01-22 ENCOUNTER — Ambulatory Visit: Admission: RE | Admit: 2018-01-22 | Payer: BLUE CROSS/BLUE SHIELD | Source: Ambulatory Visit

## 2018-01-22 DIAGNOSIS — N3001 Acute cystitis with hematuria: Secondary | ICD-10-CM | POA: Diagnosis not present

## 2018-01-24 LAB — CULTURE, URINE COMPREHENSIVE
MICRO NUMBER:: 90831116
RESULT:: NO GROWTH
SPECIMEN QUALITY:: ADEQUATE

## 2018-01-28 ENCOUNTER — Ambulatory Visit: Payer: BLUE CROSS/BLUE SHIELD

## 2018-02-03 ENCOUNTER — Ambulatory Visit
Admission: RE | Admit: 2018-02-03 | Discharge: 2018-02-03 | Disposition: A | Discharge status: Home / Self Care | Payer: BLUE CROSS/BLUE SHIELD | Source: Ambulatory Visit | Attending: Family Medicine | Admitting: Family Medicine

## 2018-02-03 DIAGNOSIS — Z1231 Encounter for screening mammogram for malignant neoplasm of breast: Secondary | ICD-10-CM | POA: Insufficient documentation

## 2018-02-03 DIAGNOSIS — Z1239 Encounter for other screening for malignant neoplasm of breast: Secondary | ICD-10-CM

## 2018-03-24 ENCOUNTER — Ambulatory Visit: Payer: BLUE CROSS/BLUE SHIELD | Admitting: Family Medicine

## 2018-04-15 DIAGNOSIS — Z01419 Encounter for gynecological examination (general) (routine) without abnormal findings: Secondary | ICD-10-CM | POA: Diagnosis not present

## 2018-04-15 DIAGNOSIS — Z86018 Personal history of other benign neoplasm: Secondary | ICD-10-CM | POA: Diagnosis not present

## 2018-04-19 ENCOUNTER — Ambulatory Visit (INDEPENDENT_AMBULATORY_CARE_PROVIDER_SITE_OTHER): Payer: BLUE CROSS/BLUE SHIELD | Admitting: Family Medicine

## 2018-04-19 ENCOUNTER — Encounter: Payer: Self-pay | Admitting: Family Medicine

## 2018-04-19 VITALS — BP 112/68 | HR 77 | Temp 98.5°F | Resp 16 | Ht 68.0 in | Wt 194.2 lb

## 2018-04-19 DIAGNOSIS — F330 Major depressive disorder, recurrent, mild: Secondary | ICD-10-CM

## 2018-04-19 DIAGNOSIS — Z23 Encounter for immunization: Secondary | ICD-10-CM

## 2018-04-19 DIAGNOSIS — H6983 Other specified disorders of Eustachian tube, bilateral: Secondary | ICD-10-CM | POA: Insufficient documentation

## 2018-04-19 DIAGNOSIS — F411 Generalized anxiety disorder: Secondary | ICD-10-CM

## 2018-04-19 DIAGNOSIS — D352 Benign neoplasm of pituitary gland: Secondary | ICD-10-CM

## 2018-04-19 DIAGNOSIS — M65331 Trigger finger, right middle finger: Secondary | ICD-10-CM | POA: Insufficient documentation

## 2018-04-19 DIAGNOSIS — M5416 Radiculopathy, lumbar region: Secondary | ICD-10-CM | POA: Insufficient documentation

## 2018-04-19 DIAGNOSIS — E538 Deficiency of other specified B group vitamins: Secondary | ICD-10-CM

## 2018-04-19 DIAGNOSIS — E785 Hyperlipidemia, unspecified: Secondary | ICD-10-CM

## 2018-04-19 MED ORDER — ALPRAZOLAM 0.5 MG PO TABS: 0.5000 mg | ORAL_TABLET | Freq: Every day | ORAL | 2 refills | Status: DC | PRN

## 2018-04-19 MED ORDER — LOVASTATIN 20 MG PO TABS: ORAL_TABLET | ORAL | 0 refills | Status: DC

## 2018-04-19 NOTE — Progress Notes (Signed)
Name: Cynthia Bean   MRN: [id number]    DOB: Feb 10, 1966   Date:04/19/2018       Progress Note  Subjective  Chief Complaint  Chief Complaint  Patient presents with  . Follow-up    3 mth f/u  . Anxiety  . Depression  . Hyperlipidemia  . perimenopause  . Prolactinoma  . Back Pain  . Hyperglycemia  . Supra pubic pressure    HPI  GAD: she states her mind is always going, feels nervous, taking Alprazolam 0.5 mg at mostoncedaily to help with symptoms, she tried Alprazolam XR 1 mg but she did not feel comfortable taking it. Taking Lexapro also, denies side effects of medication. Explained risk of long term use of BZD and also FDA changes.  She was given hydroxizine but never tried   Major Depression: sheis doing well on Lexapro, phq 9 is stable.  No suicidal thoughts or ideation. She states no cycles since May, not sleeping well since back problems. She still has hot flashes also. She does not want to change medication  Hyperlipidemia: taking lovastatin only three times weekly and last LDL was at goal at 87. HDL has also improved. Continue life style modification medication   Perimenopause:seen by GYN, had ASCUIS in 2018  diagnosed with fibroid tumors, she had a repeat last week but results not back yet  Hyperglycemia: she denies polyphagia, polydipsia or polyuria, last glucose at goal. Recheck labs yearly.   Prolactinoma : diagnosed in 2008 at [facility], she was seeing Oncologist Mamie Levers ) at [facility], took medication for one year - Dostinex , but stopped on her own.Last MRI showed stable to smaller microadenoma.She denies galactorrhea. Reviewed last labs done in January and normal  Low back pain with radiculitis: she states a six  months ago she has noticed lower left back pain radiating down left leg, worse at night, goes all the way down to left foot at times, now she thinks it is coming from the left forefoot  to the posterior knee that is a little swollen and  to left lower back. . She has also noticed intermittent swelling on left popliteal fossa and limps because of pain intermittently. Worse with activity and at night. She never took prednisone because she had wisdom tooth extracted. She still has medication at home  Right middle trigger finger: going on for the past month, taking aleve almost daily for pain for foot pain , back without help. Advised to stop all medication if she decides to take prednisone except and tylenol   Supra pubic pressure: last urine culture was negative, seeing gyn and has fibroid tumors.   Patient Active Problem List   Diagnosis Date Noted  . Trigger finger, right middle finger 04/19/2018  . Dysfunction of both eustachian tubes 04/19/2018  . ASCUS of cervix with negative high risk HPV 04/10/2017  . Abnormal uterine bleeding (AUB) 02/27/2016  . Uterine fibroid 02/27/2016  . Generalized anxiety disorder 09/20/2015  . Allergic rhinitis 05/31/2015  . Depression, major, recurrent, mild ([place]) 05/31/2015  . Dyslipidemia 05/31/2015  . Blood glucose elevated 05/31/2015  . Irritable bowel syndrome with diarrhea 05/31/2015  . Female climacteric state 05/31/2015  . Pituitary adenoma ([place]) 05/31/2015  . Restless legs syndrome 05/31/2015  . B12 deficiency 05/31/2015  . Epicondylitis, lateral (tennis elbow) 05/31/2015  . Fracture, toe 04/13/2013  . Tobacco use 05/03/2009  . Vitamin D deficiency 05/03/2009    Past Surgical History:  Procedure Laterality Date  . TONSILECTOMY,  ADENOIDECTOMY, BILATERAL MYRINGOTOMY AND TUBES    . TUBAL LIGATION    . WISDOM TOOTH EXTRACTION     patient stated that she now has a dry socket    Family History  Problem Relation Age of Onset  . Hyperlipidemia Mother   . Hypertension Mother   . Thyroid disease Mother   . Diabetes Father   . Hypertension Father   . CAD Father   . Stroke Father   . ADD / ADHD Son   . Breast cancer Maternal Grandmother     Social History    Socioeconomic History  . Marital status: Married    Spouse name: Fritz Pickerel  . Number of children: 2  . Years of education: Not on file  . Highest education level: Some college, no degree  Occupational History  . Occupation: real estate   Social Needs  . Financial resource strain: Not hard at all  . Food insecurity:    Worry: Never true    Inability: Never true  . Transportation needs:    Medical: No    Non-medical: No  Tobacco Use  . Smoking status: Former Smoker    Packs/day: 0.50    Years: 30.00    Pack years: 15.00    Types: Cigarettes  . Smokeless tobacco: Former Systems developer    Quit date: 08/06/2016  . Tobacco comment: switched to e-cigarettes  Substance and Sexual Activity  . Alcohol use: Yes    Comment: occasional  . Drug use: No  . Sexual activity: Yes    Partners: Male  Lifestyle  . Physical activity:    Days per week: 7 days    Minutes per session: 30 min  . Stress: To some extent  Relationships  . Social connections:    Talks on phone: More than three times a week    Gets together: More than three times a week    Attends religious service: Never    Active member of club or organization: No    Attends meetings of clubs or organizations: Never    Relationship status: Married  . Intimate partner violence:    Fear of current or ex partner: No    Emotionally abused: No    Physically abused: No    Forced sexual activity: No  Other Topics Concern  . Not on file  Social History Narrative   Married      Patient has 6 grandkids and a mini farm     Current Outpatient Medications:  .  ALPRAZolam (XANAX) 0.5 MG tablet, Take 1 tablet (0.5 mg total) by mouth daily as needed., Disp: 30 tablet, Rfl: 2 .  escitalopram (LEXAPRO) 20 MG tablet, Take 1 tablet (20 mg total) by mouth daily., Disp: 90 tablet, Rfl: 1 .  lovastatin (MEVACOR) 20 MG tablet, TAKE 1 TABLET BY MOUTH EVERY DAY FOR CHOLESTEROL, Disp: 90 tablet, Rfl: 1 .  Coenzyme Q10 (CO Q 10) 100 MG CAPS, Take 1  capsule by mouth daily. (Patient not taking: Reported on 01/18/2018), Disp: 30 capsule, Rfl: 0 .  Cyanocobalamin (B-12) 1000 MCG SUBL, Place 1 tablet under the tongue every other day. , Disp: , Rfl:  .  hydrOXYzine (ATARAX/VISTARIL) 25 MG tablet, Take 1 tablet (25 mg total) by mouth 3 (three) times daily as needed. (Patient not taking: Reported on 04/19/2018), Disp: 30 tablet, Rfl: 2  Allergies  Allergen Reactions  . Biaxin  [Clarithromycin] Other (See Comments)  . Bupropion Hcl Diarrhea  . Darvocet [Propoxyphene N-Acetaminophen] Nausea Only  I personally reviewed active problem list, medication list, allergies, family history, social history with the patient/caregiver today.   ROS  Constitutional: Negative for fever or weight change.  Respiratory: Negative for cough and shortness of breath.   Cardiovascular: Negative for chest pain or palpitations.  Gastrointestinal: Negative for abdominal pain, no bowel changes.  Musculoskeletal: positive for gait problem and left knee posterior joint swelling.  Skin: Negative for rash.  Neurological: Negative for dizziness or headache.  No other specific complaints in a complete review of systems (except as listed in HPI above).  Objective  Vitals:   04/19/18 1340  BP: 112/68  Pulse: 77  Resp: 16  Temp: 98.5 F (36.9 C)  TempSrc: Oral  SpO2: 99%  Weight: 194 lb 3.2 oz (88.1 kg)  Height: 5\' 8"  (1.727 m)    Body mass index is 29.53 kg/m.  Physical Exam  Constitutional: Patient appears well-developed and well-nourished. Obese  No distress.  HEENT: head atraumatic, normocephalic, pupils equal and reactive to light,  neck supple, throat within normal limits Cardiovascular: Normal rate, regular rhythm and normal heart sounds.  No murmur heard. No BLE edema. Pulmonary/Chest: Effort normal and breath sounds normal. No respiratory distress. Abdominal: Soft.  There is no tenderness. Muscular Skeletal: pain during palpation of right  metacarpal area, trigger finger present middle right, some swelling on left popliteal fossa, negative straight leg raise, antalgic gait, tender during palpation of left lower back  Psychiatric: Patient has a normal mood and affect. behavior is normal. Judgment and thought content normal.  Recent Results (from the past 2160 hour(s))  CULTURE, URINE COMPREHENSIVE     Status: None   Collection Time: 01/22/18 10:56 AM  Result Value Ref Range   MICRO NUMBER: [id number]    SPECIMEN QUALITY: ADEQUATE    Source OTHER (SPECIFY)    STATUS: FINAL    RESULT: No Growth      PHQ2/9: Depression screen [facility] 2/9 04/19/2018 01/18/2018 09/21/2017 05/22/2017 05/28/2016  Decreased Interest 0 0 1 0 0  Down, Depressed, Hopeless 1 0 1 0 0  PHQ - 2 Score 1 0 2 0 0  Altered sleeping 3 3 2  - -  Tired, decreased energy 1 0 1 - -  Change in appetite 1 0 2 - -  Feeling bad or failure about yourself  0 1 0 - -  Trouble concentrating 1 0 1 - -  Moving slowly or fidgety/restless 2 0 1 - -  Suicidal thoughts 0 0 0 - -  PHQ-9 Score 9 4 9  - -  Difficult doing work/chores - - Somewhat difficult - -    Fall Risk: Fall Risk  05/22/2017 05/28/2016 02/27/2016 09/20/2015 05/31/2015  Falls in the past year? No No Yes No No  Number falls in past yr: - - 1 - -  Injury with Fall? - - Yes - -  Comment - - Contusions on her right leg and hip  - -   GAD 7 : Generalized Anxiety Score 04/19/2018 01/18/2018  Nervous, Anxious, on Edge 3 3  Control/stop worrying 1 0  Worry too much - different things 2 1  Trouble relaxing 3 3  Restless 3 3  Easily annoyed or irritable 2 1  Afraid - awful might happen 2 3  Total GAD 7 Score 16 14  Anxiety Difficulty - Very difficult     Functional Status Survey: Is the patient deaf or have difficulty hearing?: No Does the patient have difficulty seeing, even when wearing glasses/contacts?: No  Does the patient have difficulty concentrating, remembering, or making decisions?: No Does the patient  have difficulty walking or climbing stairs?: No Does the patient have difficulty dressing or bathing?: No Does the patient have difficulty doing errands alone such as visiting a doctor's office or shopping?: No    Assessment & Plan  1. Prolactinoma ([place])  Last level back to normal, perimenopausal, does not want to go back to endo   2. Need for influenza vaccination  - Flu Vaccine QUAD 6+ mos PF IM (Fluarix Quad PF)  3. Depression, major, recurrent, mild ([place])  Continue medication, she does not want to change medication at this time  4. B12 deficiency  Continue supplementation   5. Generalized anxiety disorder  - ALPRAZolam (XANAX) 0.5 MG tablet; Take 1 tablet (0.5 mg total) by mouth daily as needed.  Dispense: 30 tablet; Refill: 2  6. Dysfunction of both eustachian tubes   7. Trigger finger, right middle finger  She will find out who she wants to see   8. Dyslipidemia  Taking medication every other day  - lovastatin (MEVACOR) 20 MG tablet; Every other day  Dispense: 45 tablet; Refill: 0   9. Chronic left lumbar radiculopathy  Try prednisone ( she still has it at home) if no resolution or improvement she will let me know who she would like to see

## 2018-04-27 DIAGNOSIS — M65331 Trigger finger, right middle finger: Secondary | ICD-10-CM | POA: Insufficient documentation

## 2018-04-29 DIAGNOSIS — Z86018 Personal history of other benign neoplasm: Secondary | ICD-10-CM | POA: Diagnosis not present

## 2018-05-21 ENCOUNTER — Encounter: Payer: Self-pay | Admitting: Family Medicine

## 2018-07-30 ENCOUNTER — Ambulatory Visit: Payer: BLUE CROSS/BLUE SHIELD | Admitting: Family Medicine

## 2018-09-12 ENCOUNTER — Other Ambulatory Visit: Payer: Self-pay | Admitting: Family Medicine

## 2018-09-12 DIAGNOSIS — E785 Hyperlipidemia, unspecified: Secondary | ICD-10-CM

## 2018-09-13 NOTE — Telephone Encounter (Signed)
Refill request for Hypertension medication:  Lovastatin 20 mg  Last office visit pertaining to hypertension: 04/19/2018  BP Readings from Last 3 Encounters:  04/19/18 112/68  01/18/18 110/68  10/02/17 110/70   Lab Results  Component Value Date   CREATININE 0.87 09/21/2017   BUN 7 09/21/2017   NA 138 09/21/2017   K 4.0 09/21/2017   CL 104 09/21/2017   CO2 29 09/21/2017    Follow-ups on file. None indicated

## 2018-09-14 ENCOUNTER — Other Ambulatory Visit: Payer: Self-pay | Admitting: Family Medicine

## 2018-09-14 DIAGNOSIS — F411 Generalized anxiety disorder: Secondary | ICD-10-CM

## 2018-09-14 DIAGNOSIS — F330 Major depressive disorder, recurrent, mild: Secondary | ICD-10-CM

## 2018-10-12 ENCOUNTER — Encounter: Payer: Self-pay | Admitting: Family Medicine

## 2018-10-13 ENCOUNTER — Ambulatory Visit (INDEPENDENT_AMBULATORY_CARE_PROVIDER_SITE_OTHER): Payer: BLUE CROSS/BLUE SHIELD | Admitting: Family Medicine

## 2018-10-13 ENCOUNTER — Encounter: Payer: Self-pay | Admitting: Family Medicine

## 2018-10-13 VITALS — BP 103/69 | Ht 68.5 in | Wt 190.2 lb

## 2018-10-13 DIAGNOSIS — F330 Major depressive disorder, recurrent, mild: Secondary | ICD-10-CM

## 2018-10-13 DIAGNOSIS — J302 Other seasonal allergic rhinitis: Secondary | ICD-10-CM | POA: Diagnosis not present

## 2018-10-13 DIAGNOSIS — E785 Hyperlipidemia, unspecified: Secondary | ICD-10-CM

## 2018-10-13 DIAGNOSIS — F411 Generalized anxiety disorder: Secondary | ICD-10-CM

## 2018-10-13 DIAGNOSIS — R079 Chest pain, unspecified: Secondary | ICD-10-CM

## 2018-10-13 DIAGNOSIS — D352 Benign neoplasm of pituitary gland: Secondary | ICD-10-CM

## 2018-10-13 MED ORDER — LORATADINE 10 MG PO TABS: 10.0000 mg | ORAL_TABLET | Freq: Every day | ORAL | 1 refills | Status: DC

## 2018-10-13 MED ORDER — FLUTICASONE PROPIONATE 50 MCG/ACT NA SUSP: 2.0000 | Freq: Every day | NASAL | 0 refills | Status: DC

## 2018-10-13 MED ORDER — HYDROXYZINE HCL 10 MG PO TABS: 10.0000 mg | ORAL_TABLET | Freq: Three times a day (TID) | ORAL | 0 refills | Status: DC | PRN

## 2018-10-13 MED ORDER — ESCITALOPRAM OXALATE 20 MG PO TABS: 20.0000 mg | ORAL_TABLET | Freq: Every day | ORAL | 0 refills | Status: DC

## 2018-10-13 MED ORDER — ALPRAZOLAM 0.5 MG PO TABS: 0.5000 mg | ORAL_TABLET | Freq: Every day | ORAL | 0 refills | Status: DC | PRN

## 2018-10-13 NOTE — Progress Notes (Signed)
Name: Cynthia Bean   MRN: [id number]    DOB: Mar 26, 1966   Date:10/13/2018       Progress Note  Subjective  Chief Complaint  Chief Complaint  Patient presents with  . Medication Refill  . Anxiety  . Depression    I connected with@ on 10/13/18 at  1:20 PM EDT by a video enabled telemedicine application and verified that I am speaking with the correct person using two identifiers.  I discussed the limitations of evaluation and management by telemedicine and the availability of in person appointments. The patient expressed understanding and agreed to proceed. Staff also discussed with the patient that there may be a patient responsible charge related to this service. Patient Location: home  Provider Location: [facility]  HPI   GAD: she states her mind is always going, feels nervous, taking Alprazolam 0.5 mg at mostoncedaily to help with symptoms, she tried Alprazolam XR 1 mg but she did not feel comfortable taking it. Taking Lexapro also, denies side effects of medication. Explained risk of long term use of BZD and also FDA changes. She was given hydroxizine but never tried , she is taking more alprazolam, she has been more  worried lately. Explained that I will send 30 pills and really advised her to try hydroxizine instead, sending lower dose today  Left chest pain: she states on left side, going on for the past couple of weeks, lasts about 5 minutes and resolves by itself. She describes it as a aching like sensation, not associated with diaphoresis, nausea or vomiting. She states usually present when she sits down. Not during activity.   Major Depression: sheis doing well on Lexapro, phq9 down to 3  No suicidal thoughts or ideation.She is compliant with medication   Hyperlipidemia: takinglovastatin only three times weekly and last LDL was at goal at 87. HDL has also improved. Continue life style modification and mevacor every other day   Perimenopause:seen  by GYN, had ASCUS in 2018  diagnosed with fibroid tumors, however last pap back to normal  Hyperglycemia: she denies polyphagia, polydipsia or polyuria, last glucose at goal. Recheck labs yearly.   Prolactinoma : diagnosed in 2008 at [facility], she was seeing Oncologist Mamie Levers ) at [facility], took medication for one year - Dostinex , but stopped on her own.Last MRI showed stable to smaller microadenoma.She denies galactorrhea or headaches   Dizziness: occasionally gets light headed, brief episodes, advised to stay hydrated, bp towards low end of normal    Patient Active Problem List   Diagnosis Date Noted  . Trigger finger, right middle finger 04/19/2018  . Dysfunction of both eustachian tubes 04/19/2018  . Chronic left lumbar radiculopathy 04/19/2018  . ASCUS of cervix with negative high risk HPV 04/10/2017  . Abnormal uterine bleeding (AUB) 02/27/2016  . Uterine fibroid 02/27/2016  . Generalized anxiety disorder 09/20/2015  . Allergic rhinitis 05/31/2015  . Depression, major, recurrent, mild ([place]) 05/31/2015  . Dyslipidemia 05/31/2015  . Blood glucose elevated 05/31/2015  . Irritable bowel syndrome with diarrhea 05/31/2015  . Female climacteric state 05/31/2015  . Pituitary adenoma ([place]) 05/31/2015  . Restless legs syndrome 05/31/2015  . B12 deficiency 05/31/2015  . Epicondylitis, lateral (tennis elbow) 05/31/2015  . Fracture, toe 04/13/2013  . Tobacco use 05/03/2009  . Vitamin D deficiency 05/03/2009    Past Surgical History:  Procedure Laterality Date  . TONSILECTOMY, ADENOIDECTOMY, BILATERAL MYRINGOTOMY AND TUBES    . TUBAL LIGATION    .  WISDOM TOOTH EXTRACTION     patient stated that she now has a dry socket    Family History  Problem Relation Age of Onset  . Hyperlipidemia Mother   . Hypertension Mother   . Thyroid disease Mother   . Kidney disease Mother   . Diabetes Father   . Hypertension Father   . CAD Father   . Stroke Father   . ADD / ADHD  Son   . Breast cancer Maternal Grandmother     Social History   Socioeconomic History  . Marital status: Married    Spouse name: Fritz Pickerel  . Number of children: 2  . Years of education: Not on file  . Highest education level: Some college, no degree  Occupational History  . Occupation: real estate   Social Needs  . Financial resource strain: Not hard at all  . Food insecurity:    Worry: Never true    Inability: Never true  . Transportation needs:    Medical: No    Non-medical: No  Tobacco Use  . Smoking status: Former Smoker    Packs/day: 0.50    Years: 30.00    Pack years: 15.00    Types: Cigarettes  . Smokeless tobacco: Former Systems developer    Quit date: 08/06/2016  . Tobacco comment: switched to e-cigarettes  Substance and Sexual Activity  . Alcohol use: Yes    Comment: occasional  . Drug use: No  . Sexual activity: Yes    Partners: Male  Lifestyle  . Physical activity:    Days per week: 7 days    Minutes per session: 30 min  . Stress: To some extent  Relationships  . Social connections:    Talks on phone: More than three times a week    Gets together: More than three times a week    Attends religious service: Never    Active member of club or organization: No    Attends meetings of clubs or organizations: Never    Relationship status: Married  . Intimate partner violence:    Fear of current or ex partner: No    Emotionally abused: No    Physically abused: No    Forced sexual activity: No  Other Topics Concern  . Not on file  Social History Narrative   Married      Patient has 6 grandkids and a mini farm     Current Outpatient Medications:  .  ALPRAZolam (XANAX) 0.5 MG tablet, Take 1 tablet (0.5 mg total) by mouth daily as needed., Disp: 30 tablet, Rfl: 2 .  Coenzyme Q10 (CO Q 10) 100 MG CAPS, Take 1 capsule by mouth daily., Disp: 30 capsule, Rfl: 0 .  escitalopram (LEXAPRO) 20 MG tablet, TAKE 1 TABLET BY MOUTH EVERY DAY, Disp: 90 tablet, Rfl: 0 .  Ginger,  Zingiber officinalis, (GINGER PO), Take 12 mg by mouth daily., Disp: , Rfl:  .  lovastatin (MEVACOR) 20 MG tablet, TAKE 1 TABLET BY MOUTH EVERY OTHER DAY, Disp: 45 tablet, Rfl: 0 .  Cyanocobalamin (B-12) 1000 MCG SUBL, Place 1 tablet under the tongue every other day. , Disp: , Rfl:  .  hydrOXYzine (ATARAX/VISTARIL) 25 MG tablet, Take 1 tablet (25 mg total) by mouth 3 (three) times daily as needed. (Patient not taking: Reported on 04/19/2018), Disp: 30 tablet, Rfl: 2  Allergies  Allergen Reactions  . Biaxin  [Clarithromycin] Other (See Comments)  . Bupropion Hcl Diarrhea  . Darvocet [Propoxyphene N-Acetaminophen] Nausea Only  I personally reviewed active problem list, medication list, allergies, family history, social history with the patient/caregiver today.   ROS  Constitutional: Negative for fever or weight change.  Respiratory: Negative for cough and shortness of breath.   Cardiovascular: positive  for chest pain but no  palpitations.  Gastrointestinal: Negative for abdominal pain, no bowel changes.  Musculoskeletal: Negative for gait problem or joint swelling.  Skin: Negative for rash.  Neurological: positive  for dizziness but no   headache.  No other specific complaints in a complete review of systems (except as listed in HPI above).  Objective  Virtual encounter, vitals not obtained.   Physical Exam  Awake, alert, well groomed and in no distress    PHQ2/9: Depression screen [facility] 2/9 10/12/2018 04/19/2018 01/18/2018 09/21/2017 05/22/2017  Decreased Interest 0 0 0 1 0  Down, Depressed, Hopeless 0 1 0 1 0  PHQ - 2 Score 0 1 0 2 0  Altered sleeping 3 3 3 2  -  Tired, decreased energy 0 1 0 1 -  Change in appetite 0 1 0 2 -  Feeling bad or failure about yourself  0 0 1 0 -  Trouble concentrating 0 1 0 1 -  Moving slowly or fidgety/restless 0 2 0 1 -  Suicidal thoughts 0 0 0 0 -  PHQ-9 Score 3 9 4 9  -  Difficult doing work/chores Somewhat difficult - - Somewhat difficult -    PHQ-2/9 Result is positive.    GAD 7 : Generalized Anxiety Score 10/12/2018 04/19/2018 01/18/2018  Nervous, Anxious, on Edge 3 3 3   Control/stop worrying 0 1 0  Worry too much - different things 1 2 1   Trouble relaxing 3 3 3   Restless 3 3 3   Easily annoyed or irritable 1 2 1   Afraid - awful might happen 3 2 3   Total GAD 7 Score 14 16 14   Anxiety Difficulty Somewhat difficult - Very difficult     Fall Risk: Fall Risk  10/12/2018 05/22/2017 05/28/2016 02/27/2016 09/20/2015  Falls in the past year? 0 No No Yes No  Number falls in past yr: 0 - - 1 -  Injury with Fall? 0 - - Yes -  Comment - - - Contusions on her right leg and hip  -     Assessment & Plan  1. Generalized anxiety disorder  - escitalopram (LEXAPRO) 20 MG tablet; Take 1 tablet (20 mg total) by mouth daily.  Dispense: 90 tablet; Refill: 0 - hydrOXYzine (ATARAX/VISTARIL) 10 MG tablet; Take 1 tablet (10 mg total) by mouth 3 (three) times daily as needed.  Dispense: 90 tablet; Refill: 0 - ALPRAZolam (XANAX) 0.5 MG tablet; Take 1 tablet (0.5 mg total) by mouth daily as needed.  Dispense: 30 tablet; Refill: 0 Advised to back down on taking alprazolam and try hydroxizine, explained tachyphylaxis with BZD's   2. Depression, major, recurrent, mild ([facility])  - escitalopram (LEXAPRO) 20 MG tablet; Take 1 tablet (20 mg total) by mouth daily.  Dispense: 90 tablet; Refill: 0  3. Dyslipidemia  Continue medication, taking every other day, recheck labs next visit  4. Seasonal allergies  - loratadine (CLARITIN) 10 MG tablet; Take 1 tablet (10 mg total) by mouth daily.  Dispense: 90 tablet; Refill: 1 - fluticasone (FLONASE) 50 MCG/ACT nasal spray; Place 2 sprays into both nostrils daily.  Dispense: 48 g; Refill: 0  5. Prolactinoma ([place])  Recheck labs next visit   6. Chest pain in adult  Discussed symptoms of heart attack,  importance of timing and if any red flags to call 911  I discussed the assessment and treatment plan with the  patient. The patient was provided an opportunity to ask questions and all were answered. The patient agreed with the plan and demonstrated an understanding of the instructions.  The patient was advised to call back or seek an in-person evaluation if the symptoms worsen or if the condition fails to improve as anticipated.  I provided 26 minutes of non-face-to-face time during this encounter.

## 2018-10-14 ENCOUNTER — Encounter: Payer: Self-pay | Admitting: Family Medicine

## 2018-11-05 ENCOUNTER — Other Ambulatory Visit: Payer: Self-pay | Admitting: Family Medicine

## 2018-11-05 DIAGNOSIS — F411 Generalized anxiety disorder: Secondary | ICD-10-CM

## 2018-11-05 NOTE — Telephone Encounter (Signed)
Refill request for general medication: Hydroxyzine 10 mg  Last office visit: 10/13/2018  Follow-ups on file. 02/15/2019

## 2018-11-11 ENCOUNTER — Encounter: Payer: Self-pay | Admitting: Family Medicine

## 2018-11-26 ENCOUNTER — Encounter: Payer: Self-pay | Admitting: Family Medicine

## 2018-12-04 ENCOUNTER — Other Ambulatory Visit: Payer: Self-pay | Admitting: Family Medicine

## 2018-12-04 DIAGNOSIS — E785 Hyperlipidemia, unspecified: Secondary | ICD-10-CM

## 2018-12-10 ENCOUNTER — Other Ambulatory Visit: Payer: Self-pay | Admitting: Family Medicine

## 2018-12-10 ENCOUNTER — Encounter: Payer: Self-pay | Admitting: Family Medicine

## 2018-12-10 DIAGNOSIS — F411 Generalized anxiety disorder: Secondary | ICD-10-CM

## 2018-12-10 MED ORDER — ALPRAZOLAM 0.5 MG PO TABS: 0.5000 mg | ORAL_TABLET | Freq: Every day | ORAL | 0 refills | Status: DC | PRN

## 2018-12-11 ENCOUNTER — Other Ambulatory Visit: Payer: Self-pay | Admitting: Family Medicine

## 2018-12-11 DIAGNOSIS — E785 Hyperlipidemia, unspecified: Secondary | ICD-10-CM

## 2018-12-28 ENCOUNTER — Telehealth: Payer: Self-pay | Admitting: Gastroenterology

## 2018-12-28 NOTE — Telephone Encounter (Signed)
Patient called to see if she is due to have a colonoscopy. Her mother was just diagnosed with colon ca. She had he last colonoscopy by Dr Allen Norris on 05-2014. Please advise.

## 2018-12-29 NOTE — Telephone Encounter (Signed)
Pt stated her mother was just dx'd with colon cancer and she is in her 60's. Per Dr. Allen Norris, if a 1st degree relative is less than the age of 98 the time between colonoscopies will increase. Pt will stay with the 5 year repeat depending on her next colonoscopy pathology results if polyps are removed.

## 2019-01-05 ENCOUNTER — Other Ambulatory Visit: Payer: Self-pay | Admitting: Family Medicine

## 2019-01-05 DIAGNOSIS — J302 Other seasonal allergic rhinitis: Secondary | ICD-10-CM

## 2019-01-05 NOTE — Telephone Encounter (Signed)
Refill request for general medication. Flonase to [facility]  Last office visit 10/13/2018   Follow up on 02/15/2019

## 2019-02-15 ENCOUNTER — Ambulatory Visit (INDEPENDENT_AMBULATORY_CARE_PROVIDER_SITE_OTHER): Payer: BC Managed Care – PPO | Admitting: Family Medicine

## 2019-02-15 ENCOUNTER — Encounter: Payer: Self-pay | Admitting: Family Medicine

## 2019-02-15 ENCOUNTER — Other Ambulatory Visit: Payer: Self-pay

## 2019-02-15 VITALS — BP 110/61 | HR 77 | Wt 190.0 lb

## 2019-02-15 DIAGNOSIS — M2550 Pain in unspecified joint: Secondary | ICD-10-CM

## 2019-02-15 DIAGNOSIS — E538 Deficiency of other specified B group vitamins: Secondary | ICD-10-CM

## 2019-02-15 DIAGNOSIS — Z86018 Personal history of other benign neoplasm: Secondary | ICD-10-CM | POA: Insufficient documentation

## 2019-02-15 DIAGNOSIS — E785 Hyperlipidemia, unspecified: Secondary | ICD-10-CM

## 2019-02-15 DIAGNOSIS — F330 Major depressive disorder, recurrent, mild: Secondary | ICD-10-CM

## 2019-02-15 DIAGNOSIS — R739 Hyperglycemia, unspecified: Secondary | ICD-10-CM | POA: Diagnosis not present

## 2019-02-15 DIAGNOSIS — E559 Vitamin D deficiency, unspecified: Secondary | ICD-10-CM | POA: Diagnosis not present

## 2019-02-15 DIAGNOSIS — D352 Benign neoplasm of pituitary gland: Secondary | ICD-10-CM

## 2019-02-15 DIAGNOSIS — F411 Generalized anxiety disorder: Secondary | ICD-10-CM

## 2019-02-15 MED ORDER — ALPRAZOLAM 0.5 MG PO TABS: 0.5000 mg | ORAL_TABLET | Freq: Every day | ORAL | 0 refills | Status: DC | PRN

## 2019-02-15 MED ORDER — LOVASTATIN 20 MG PO TABS: 20.0000 mg | ORAL_TABLET | ORAL | 1 refills | Status: DC

## 2019-02-15 MED ORDER — ESCITALOPRAM OXALATE 20 MG PO TABS: 20.0000 mg | ORAL_TABLET | Freq: Every day | ORAL | 1 refills | Status: DC

## 2019-02-15 NOTE — Progress Notes (Signed)
Name: Cynthia Bean   MRN: [id number]    DOB: 06/03/1966   Date:02/15/2019       Progress Note  Subjective  Chief Complaint  Chief Complaint  Patient presents with  . Medication Refill    4 month F/U  . Depression  . GAD  . Hyperlipidemia    I connected with  Moya L Pagett  on 02/15/19 at  1:20 PM EDT by a video enabled telemedicine application and verified that I am speaking with the correct person using two identifiers.  I discussed the limitations of evaluation and management by telemedicine and the availability of in person appointments. The patient expressed understanding and agreed to proceed. Staff also discussed with the patient that there may be a patient responsible charge related to this service. Patient Location: at home Provider Location: [facility]   HPI  GAD:she is taking alprazolam seldom, half to a quarter every other day, she is on lexapro, the 30 pills needs to last until next follow up  Major Depression/GAD  sheis doing well on Lexapro, phq9 is a little higher at 27, mother was diagnosed with cancer, moved in with her and she is tired, but states secondary to circumstance. No suicidal thoughts or ideation.She is compliant with medication   Hyperlipidemia: takinglovastatin only three times weekly and last LDL was at goal at 87. We will check labs again  Perimenopause:seen by GYN, [place] in 2018, but had a repeat in 2019 was back to normal diagnosed with fibroid tumors  Hyperglycemia: she denies polyphagia, polydipsia or polyuria, last glucose at [facility] labs yearly.  Prolactinoma : diagnosed in 2008 at [facility], she was seeing Oncologist Mamie Levers ) at [facility], took medication for one year - Dostinex , but stopped on her own.Last MRI showed stable to smaller microadenoma , prolactin also back to normal.She denies galactorrhea or headaches   Dizziness: she is doing better, no recent episodes.   Patient Active  Problem List   Diagnosis Date Noted  . History of uterine fibroid 02/15/2019  . Acquired trigger finger of right middle finger 04/27/2018  . Trigger finger, right middle finger 04/19/2018  . Dysfunction of both eustachian tubes 04/19/2018  . Chronic left lumbar radiculopathy 04/19/2018  . ASCUS of cervix with negative high risk HPV 04/10/2017  . Abnormal uterine bleeding (AUB) 02/27/2016  . Uterine fibroid 02/27/2016  . Generalized anxiety disorder 09/20/2015  . Allergic rhinitis 05/31/2015  . Depression, major, recurrent, mild ([place]) 05/31/2015  . Dyslipidemia 05/31/2015  . Blood glucose elevated 05/31/2015  . Irritable bowel syndrome with diarrhea 05/31/2015  . Female climacteric state 05/31/2015  . Pituitary adenoma ([place]) 05/31/2015  . Restless legs syndrome 05/31/2015  . B12 deficiency 05/31/2015  . Epicondylitis, lateral (tennis elbow) 05/31/2015  . Fracture, toe 04/13/2013  . Tobacco use 05/03/2009  . Vitamin D deficiency 05/03/2009    Past Surgical History:  Procedure Laterality Date  . TONSILECTOMY, ADENOIDECTOMY, BILATERAL MYRINGOTOMY AND TUBES    . TUBAL LIGATION    . WISDOM TOOTH EXTRACTION     patient stated that she now has a dry socket    Family History  Problem Relation Age of Onset  . Hyperlipidemia Mother   . Hypertension Mother   . Thyroid disease Mother   . Kidney disease Mother   . Diabetes Father   . Hypertension Father   . CAD Father   . Stroke Father   . ADD / ADHD Son   . Breast cancer  Maternal Grandmother     Social History   Socioeconomic History  . Marital status: Married    Spouse name: Fritz Pickerel  . Number of children: 2  . Years of education: Not on file  . Highest education level: Some college, no degree  Occupational History  . Occupation: real estate   Social Needs  . Financial resource strain: Not hard at all  . Food insecurity    Worry: Never true    Inability: Never true  . Transportation needs    Medical: No     Non-medical: No  Tobacco Use  . Smoking status: Former Smoker    Packs/day: 0.50    Years: 30.00    Pack years: 15.00    Types: Cigarettes  . Smokeless tobacco: Former Systems developer    Quit date: 08/06/2016  . Tobacco comment: switched to e-cigarettes  Substance and Sexual Activity  . Alcohol use: Yes    Comment: occasional  . Drug use: No  . Sexual activity: Yes    Partners: Male  Lifestyle  . Physical activity    Days per week: 7 days    Minutes per session: 30 min  . Stress: To some extent  Relationships  . Social connections    Talks on phone: More than three times a week    Gets together: More than three times a week    Attends religious service: Never    Active member of club or organization: No    Attends meetings of clubs or organizations: Never    Relationship status: Married  . Intimate partner violence    Fear of current or ex partner: No    Emotionally abused: No    Physically abused: No    Forced sexual activity: No  Other Topics Concern  . Not on file  Social History Narrative   Married      Patient has 6 grandkids and a mini farm     Current Outpatient Medications:  .  ALPRAZolam (XANAX) 0.5 MG tablet, Take 1 tablet (0.5 mg total) by mouth daily as needed., Disp: 30 tablet, Rfl: 0 .  escitalopram (LEXAPRO) 20 MG tablet, Take 1 tablet (20 mg total) by mouth daily., Disp: 90 tablet, Rfl: 0 .  fluticasone (FLONASE) 50 MCG/ACT nasal spray, SPRAY 2 SPRAYS INTO EACH NOSTRIL EVERY DAY, Disp: 48 mL, Rfl: 0 .  Ginger, Zingiber officinalis, (GINGER PO), Take 12 mg by mouth daily., Disp: , Rfl:  .  loratadine (CLARITIN) 10 MG tablet, Take 1 tablet (10 mg total) by mouth daily., Disp: 90 tablet, Rfl: 1 .  lovastatin (MEVACOR) 20 MG tablet, TAKE 1 TABLET BY MOUTH EVERY DAY FOR CHOLESTEROL (Patient taking differently: Take 20 mg by mouth every other day. TAKE 1 TABLET BY MOUTH EVERY DAY FOR CHOLESTEROL), Disp: 90 tablet, Rfl: 0 .  Cyanocobalamin (B-12) 1000 MCG SUBL, Place 1  tablet under the tongue every other day. , Disp: , Rfl:   Allergies  Allergen Reactions  . Biaxin  [Clarithromycin] Other (See Comments)  . Bupropion Hcl Diarrhea  . Darvocet [Propoxyphene N-Acetaminophen] Nausea Only    I personally reviewed active problem list, medication list, allergies, family history, social history, health maintenance with the patient/caregiver today.   ROS  Ten systems reviewed and is negative except as mentioned in HPI   Objective  Virtual encounter  Today's Vitals   02/15/19 1343  BP: 110/61  Pulse: 77  Weight: 190 lb (86.2 kg)   Body mass index is 28.47 kg/m.  There is no height or weight on file to calculate BMI.  Physical Exam  Awake, alert and oriented  PHQ2/9: Depression screen [facility] 2/9 02/15/2019 10/12/2018 04/19/2018 01/18/2018 09/21/2017  Decreased Interest 1 0 0 0 1  Down, Depressed, Hopeless 1 0 1 0 1  PHQ - 2 Score 2 0 1 0 2  Altered sleeping 1 3 3 3 2   Tired, decreased energy 0 0 1 0 1  Change in appetite 1 0 1 0 2  Feeling bad or failure about yourself  0 0 0 1 0  Trouble concentrating 0 0 1 0 1  Moving slowly or fidgety/restless 1 0 2 0 1  Suicidal thoughts 0 0 0 0 0  PHQ-9 Score 5 3 9 4 9   Difficult doing work/chores Somewhat difficult Somewhat difficult - - Somewhat difficult   PHQ-2/9 Result is positive.    Fall Risk: Fall Risk  02/15/2019 10/12/2018 05/22/2017 05/28/2016 02/27/2016  Falls in the past year? 0 0 No No Yes  Number falls in past yr: 0 0 - - 1  Injury with Fall? 0 0 - - Yes  Comment - - - - Contusions on her right leg and hip     Assessment & Plan  1. Prolactinoma ([place])  - Prolactin  2. B12 deficiency  - B12  3. Vitamin D deficiency   4. Blood glucose elevated  - Hemoglobin A1c  5. Dyslipidemia  - COMPLETE METABOLIC PANEL WITH GFR - Lipid panel - lovastatin (MEVACOR) 20 MG tablet; Take 1 tablet (20 mg total) by mouth every other day. TAKE 1 TABLET BY MOUTH EVERY DAY FOR CHOLESTEROL  Dispense:  45 tablet; Refill: 1  6. Depression, major, recurrent, mild (H[facility])  - CBC with Differential/Platelet - escitalopram (LEXAPRO) 20 MG tablet; Take 1 tablet (20 mg total) by mouth daily.  Dispense: 90 tablet; Refill: 1  7. Generalized anxiety disorder  - escitalopram (LEXAPRO) 20 MG tablet; Take 1 tablet (20 mg total) by mouth daily.  Dispense: 90 tablet; Refill: 1 - ALPRAZolam (XANAX) 0.5 MG tablet; Take 1 tablet (0.5 mg total) by mouth daily as needed.  Dispense: 30 tablet; Refill: 0  8. Arthralgia of multiple joints  Discussed taking tylenol   I discussed the assessment and treatment plan with the patient. The patient was provided an opportunity to ask questions and all were answered. The patient agreed with the plan and demonstrated an understanding of the instructions.  The patient was advised to call back or seek an in-person evaluation if the symptoms worsen or if the condition fails to improve as anticipated.  I provided 25  minutes of non-face-to-face time during this encounter.

## 2019-03-07 ENCOUNTER — Ambulatory Visit (INDEPENDENT_AMBULATORY_CARE_PROVIDER_SITE_OTHER): Payer: BC Managed Care – PPO

## 2019-03-07 ENCOUNTER — Other Ambulatory Visit: Payer: Self-pay

## 2019-03-07 DIAGNOSIS — Z23 Encounter for immunization: Secondary | ICD-10-CM | POA: Diagnosis not present

## 2019-03-07 NOTE — Progress Notes (Signed)
Immunization status: Patient had her Tdap and Pneumonia 13. Patient states she called Insurance regarding coverage for Prevnar 13 and it would be covered under her plan.

## 2019-03-08 DIAGNOSIS — F330 Major depressive disorder, recurrent, mild: Secondary | ICD-10-CM | POA: Diagnosis not present

## 2019-03-08 DIAGNOSIS — R739 Hyperglycemia, unspecified: Secondary | ICD-10-CM | POA: Diagnosis not present

## 2019-03-08 DIAGNOSIS — E785 Hyperlipidemia, unspecified: Secondary | ICD-10-CM | POA: Diagnosis not present

## 2019-03-08 DIAGNOSIS — D352 Benign neoplasm of pituitary gland: Secondary | ICD-10-CM | POA: Diagnosis not present

## 2019-03-09 ENCOUNTER — Encounter: Payer: Self-pay | Admitting: Family Medicine

## 2019-03-09 LAB — CBC WITH DIFFERENTIAL/PLATELET
Absolute Monocytes: 688 cells/uL (ref 200–950)
Basophils Absolute: 40 cells/uL (ref 0–200)
Basophils Relative: 0.5 %
Eosinophils Absolute: 80 cells/uL (ref 15–500)
Eosinophils Relative: 1 %
HCT: 39.1 % (ref 35.0–45.0)
Hemoglobin: 13.3 g/dL (ref 11.7–15.5)
Lymphs Abs: 1456 cells/uL (ref 850–3900)
MCH: 32.4 pg (ref 27.0–33.0)
MCHC: 34 g/dL (ref 32.0–36.0)
MCV: 95.4 fL (ref 80.0–100.0)
MPV: 9.9 fL (ref 7.5–12.5)
Monocytes Relative: 8.6 %
Neutro Abs: 5736 cells/uL (ref 1500–7800)
Neutrophils Relative %: 71.7 %
Platelets: 292 10*3/uL (ref 140–400)
RBC: 4.1 10*6/uL (ref 3.80–5.10)
RDW: 12.1 % (ref 11.0–15.0)
Total Lymphocyte: 18.2 %
WBC: 8 10*3/uL (ref 3.8–10.8)

## 2019-03-09 LAB — LIPID PANEL
Cholesterol: 211 mg/dL — ABNORMAL HIGH (ref <200)
HDL: 52 mg/dL (ref >=50)
LDL Cholesterol (Calc): 139 mg/dL (calc) — ABNORMAL HIGH
Non-HDL Cholesterol (Calc): 159 mg/dL (calc) — ABNORMAL HIGH (ref <130)
Total CHOL/HDL Ratio: 4.1 (calc) (ref <5.0)
Triglycerides: 97 mg/dL (ref <150)

## 2019-03-09 LAB — COMPLETE METABOLIC PANEL WITH GFR
AG Ratio: 1.9 (calc) (ref 1.0–2.5)
ALT: 11 U/L (ref 6–29)
AST: 13 U/L (ref 10–35)
Albumin: 4.3 g/dL (ref 3.6–5.1)
Alkaline phosphatase (APISO): 99 U/L (ref 37–153)
BUN: 13 mg/dL (ref 7–25)
CO2: 28 mmol/L (ref 20–32)
Calcium: 9.6 mg/dL (ref 8.6–10.4)
Chloride: 105 mmol/L (ref 98–110)
Creat: 0.91 mg/dL (ref 0.50–1.05)
GFR, Est African American: 83 mL/min/{1.73_m2} (ref >=60)
GFR, Est Non African American: 72 mL/min/{1.73_m2} (ref >=60)
Globulin: 2.3 g/dL (calc) (ref 1.9–3.7)
Glucose, Bld: 85 mg/dL (ref 65–99)
Potassium: 4.3 mmol/L (ref 3.5–5.3)
Sodium: 140 mmol/L (ref 135–146)
Total Bilirubin: 0.4 mg/dL (ref 0.2–1.2)
Total Protein: 6.6 g/dL (ref 6.1–8.1)

## 2019-03-09 LAB — VITAMIN B12: Vitamin B-12: 384 pg/mL (ref 200–1100)

## 2019-03-09 LAB — HEMOGLOBIN A1C
Hgb A1c MFr Bld: 5.4 % of total Hgb (ref <5.7)
Mean Plasma Glucose: 108 (calc)
eAG (mmol/L): 6 (calc)

## 2019-03-09 LAB — PROLACTIN: Prolactin: 7.9 ng/mL

## 2019-03-15 DIAGNOSIS — M79673 Pain in unspecified foot: Secondary | ICD-10-CM | POA: Insufficient documentation

## 2019-03-15 DIAGNOSIS — M79643 Pain in unspecified hand: Secondary | ICD-10-CM | POA: Insufficient documentation

## 2019-04-10 ENCOUNTER — Other Ambulatory Visit: Payer: Self-pay | Admitting: Family Medicine

## 2019-04-10 DIAGNOSIS — J302 Other seasonal allergic rhinitis: Secondary | ICD-10-CM

## 2019-04-13 ENCOUNTER — Ambulatory Visit (INDEPENDENT_AMBULATORY_CARE_PROVIDER_SITE_OTHER): Payer: BC Managed Care – PPO

## 2019-04-13 DIAGNOSIS — Z23 Encounter for immunization: Secondary | ICD-10-CM | POA: Diagnosis not present

## 2019-04-25 ENCOUNTER — Telehealth: Payer: Self-pay

## 2019-04-25 NOTE — Telephone Encounter (Signed)
Copied from [place] [id number]. Topic: General - Other >> Apr 25, 2019  2:14 PM Sheran Luz wrote: Patient requesting code for prevnar 23 for insurance purposes. Patient requesting call back

## 2019-04-26 NOTE — Telephone Encounter (Signed)
LVM informing patient the cpt code for Prevnar 23 is 90732.

## 2019-05-05 ENCOUNTER — Other Ambulatory Visit: Payer: Self-pay

## 2019-05-05 DIAGNOSIS — Z86010 Personal history of colonic polyps: Secondary | ICD-10-CM

## 2019-05-15 ENCOUNTER — Other Ambulatory Visit: Payer: Self-pay | Admitting: Family Medicine

## 2019-05-15 DIAGNOSIS — F411 Generalized anxiety disorder: Secondary | ICD-10-CM

## 2019-05-16 NOTE — Telephone Encounter (Signed)
Requested medication (s) are due for refill today: yes  Requested medication (s) are on the active medication list: yes  Last refill:  02/15/2019  Future visit scheduled: yes  Notes to clinic:  Refill cannot be delegated    Requested Prescriptions  Pending Prescriptions Disp Refills   ALPRAZolam (XANAX) 0.5 MG tablet [Pharmacy Med Name: ALPRAZOLAM 0.5 MG TABLET] 30 tablet 0    Sig: Take 1 tablet (0.5 mg total) by mouth daily as needed.     Not Delegated - Psychiatry:  Anxiolytics/Hypnotics Failed - 05/15/2019  1:05 PM      Failed - This refill cannot be delegated      Failed - Urine Drug Screen completed in last 360 days.      Passed - Valid encounter within last 6 months    Recent Outpatient Visits          3 months ago Prolactinoma [facility])   [facility] [place], Drue Stager, MD   7 months ago Depression, major, recurrent, mild [facility])   [facility] Steele Sizer, MD   1 year ago Prolactinoma [facility])   [facility] Steele Sizer, MD   1 year ago Hematuria due to acute cystitis   [facility] Steele Sizer, MD   1 year ago Throat pain   [place], [place]      Future Appointments            In 1 week Steele Sizer, MD [facility], [facility]

## 2019-05-24 ENCOUNTER — Ambulatory Visit (INDEPENDENT_AMBULATORY_CARE_PROVIDER_SITE_OTHER): Payer: BC Managed Care – PPO | Admitting: Family Medicine

## 2019-05-24 ENCOUNTER — Other Ambulatory Visit: Payer: Self-pay

## 2019-05-24 ENCOUNTER — Encounter: Payer: Self-pay | Admitting: Family Medicine

## 2019-05-24 DIAGNOSIS — M2550 Pain in unspecified joint: Secondary | ICD-10-CM

## 2019-05-24 DIAGNOSIS — E559 Vitamin D deficiency, unspecified: Secondary | ICD-10-CM | POA: Diagnosis not present

## 2019-05-24 DIAGNOSIS — F411 Generalized anxiety disorder: Secondary | ICD-10-CM

## 2019-05-24 DIAGNOSIS — E785 Hyperlipidemia, unspecified: Secondary | ICD-10-CM

## 2019-05-24 DIAGNOSIS — E538 Deficiency of other specified B group vitamins: Secondary | ICD-10-CM | POA: Diagnosis not present

## 2019-05-24 DIAGNOSIS — F330 Major depressive disorder, recurrent, mild: Secondary | ICD-10-CM

## 2019-05-24 DIAGNOSIS — R202 Paresthesia of skin: Secondary | ICD-10-CM

## 2019-05-24 MED ORDER — PREGABALIN 50 MG PO CAPS: 50.0000 mg | ORAL_CAPSULE | Freq: Three times a day (TID) | ORAL | 0 refills | Status: DC

## 2019-05-24 MED ORDER — LOVASTATIN 20 MG PO TABS: 20.0000 mg | ORAL_TABLET | Freq: Every day | ORAL | 0 refills | Status: DC

## 2019-05-24 MED ORDER — ALPRAZOLAM 0.5 MG PO TABS: 0.5000 mg | ORAL_TABLET | Freq: Every day | ORAL | 0 refills | Status: DC | PRN

## 2019-05-24 NOTE — Progress Notes (Signed)
Name: Cynthia Bean   MRN: [id number]    DOB: 12-19-1965   Date:05/24/2019       Progress Note  Subjective  Chief Complaint  Chief Complaint  Patient presents with  . Follow-up    I connected with  Cynthia Bean  on 05/24/19 at 11:00 AM EST by a video enabled telemedicine application and verified that I am speaking with the correct person using two identifiers.  I discussed the limitations of evaluation and management by telemedicine and the availability of in person appointments. The patient expressed understanding and agreed to proceed. Staff also discussed with the patient that there may be a patient responsible charge related to this service. Patient Location: at home  Provider Location: [facility]   HPI  GAD:she is taking alprazolam seldom, half to a quarter every other day, she is on lexapro and unchanged   B12 deficiency: she states she has noticed sharp needles on the tips of her toes for the past few months, she states worse when she is not wearing shoes. She is back on supplementation   Trigger finger: she has also noticed hands are stiff all day long, and worse at night. Discussed labs for inflammatory arthritis   Major Depression/GAD  sheis doing well on Lexapro,phq9 is a little back down to 3, from 5. No suicidal thoughts or ideation.She is compliant with medication  Hyperlipidemia: takinglovastatin only three times weekly and last LDL went up from was at goal at 87. We will check labs again  ASCUS in 2018, but had a repeat in 2019 was back to normal diagnosed with fibroid tumors, but no problems , doing well   Hyperglycemia: she denies polyphagia, polydipsia or polyuria, last glucose at goal.Normal A1C in August 2020 also normal glucose .  Prolactinoma : diagnosed in 2008 at [facility], she was seeing Oncologist Mamie Levers ) at [facility], took medication for one year - Dostinex , but stopped on her own.Last MRI showed stable to  smaller microadenoma , prolactin also back to normal.She denies galactorrhea, she very seldom has headaches now. She states doing better since menopause    Patient Active Problem List   Diagnosis Date Noted  . History of uterine fibroid 02/15/2019  . Acquired trigger finger of right middle finger 04/27/2018  . Trigger finger, right middle finger 04/19/2018  . Dysfunction of both eustachian tubes 04/19/2018  . Chronic left lumbar radiculopathy 04/19/2018  . ASCUS of cervix with negative high risk HPV 04/10/2017  . Abnormal uterine bleeding (AUB) 02/27/2016  . Uterine fibroid 02/27/2016  . Generalized anxiety disorder 09/20/2015  . Allergic rhinitis 05/31/2015  . Depression, major, recurrent, mild ([place]) 05/31/2015  . Dyslipidemia 05/31/2015  . Blood glucose elevated 05/31/2015  . Irritable bowel syndrome with diarrhea 05/31/2015  . Female climacteric state 05/31/2015  . Pituitary adenoma ([place]) 05/31/2015  . Restless legs syndrome 05/31/2015  . B12 deficiency 05/31/2015  . Epicondylitis, lateral (tennis elbow) 05/31/2015  . Fracture, toe 04/13/2013  . Tobacco use 05/03/2009  . Vitamin D deficiency 05/03/2009    Past Surgical History:  Procedure Laterality Date  . TONSILECTOMY, ADENOIDECTOMY, BILATERAL MYRINGOTOMY AND TUBES    . TUBAL LIGATION    . WISDOM TOOTH EXTRACTION     patient stated that she now has a dry socket    Family History  Problem Relation Age of Onset  . Hyperlipidemia Mother   . Hypertension Mother   . Thyroid disease Mother   . Kidney disease  Mother   . Colon cancer Mother   . Diabetes Father   . Hypertension Father   . CAD Father   . Stroke Father   . ADD / ADHD Son   . Breast cancer Maternal Grandmother     Social History   Socioeconomic History  . Marital status: Married    Spouse name: Cynthia Bean  . Number of children: 2  . Years of education: Not on file  . Highest education level: Some college, no degree  Occupational History  .  Occupation: real estate   Social Needs  . Financial resource strain: Not hard at all  . Food insecurity    Worry: Never true    Inability: Never true  . Transportation needs    Medical: No    Non-medical: No  Tobacco Use  . Smoking status: Former Smoker    Packs/day: 0.50    Years: 30.00    Pack years: 15.00    Types: Cigarettes  . Smokeless tobacco: Former Systems developer    Quit date: 08/06/2016  . Tobacco comment: switched to e-cigarettes  Substance and Sexual Activity  . Alcohol use: Yes    Comment: occasional  . Drug use: No  . Sexual activity: Yes    Partners: Male  Lifestyle  . Physical activity    Days per week: 7 days    Minutes per session: 30 min  . Stress: To some extent  Relationships  . Social connections    Talks on phone: More than three times a week    Gets together: More than three times a week    Attends religious service: Never    Active member of club or organization: No    Attends meetings of clubs or organizations: Never    Relationship status: Married  . Intimate partner violence    Fear of current or ex partner: No    Emotionally abused: No    Physically abused: No    Forced sexual activity: No  Other Topics Concern  . Not on file  Social History Narrative   Married      Patient has 6 grandkids and a mini farm     Current Outpatient Medications:  .  ALPRAZolam (XANAX) 0.5 MG tablet, Take 1 tablet (0.5 mg total) by mouth daily as needed., Disp: 30 tablet, Rfl: 0 .  Cyanocobalamin (B-12) 1000 MCG SUBL, Place 1 tablet under the tongue every other day. , Disp: , Rfl:  .  escitalopram (LEXAPRO) 20 MG tablet, Take 1 tablet (20 mg total) by mouth daily., Disp: 90 tablet, Rfl: 1 .  fluticasone (FLONASE) 50 MCG/ACT nasal spray, SPRAY 2 SPRAYS INTO EACH NOSTRIL EVERY DAY, Disp: 48 mL, Rfl: 0 .  Ginger, Zingiber officinalis, (GINGER PO), Take 12 mg by mouth daily., Disp: , Rfl:  .  loratadine (CLARITIN) 10 MG tablet, TAKE 1 TABLET BY MOUTH EVERY DAY, Disp:  90 tablet, Rfl: 1 .  lovastatin (MEVACOR) 20 MG tablet, Take 1 tablet (20 mg total) by mouth daily. TAKE 1 TABLET BY MOUTH EVERY DAY FOR CHOLESTEROL, Disp: 90 tablet, Rfl: 0 .  SUPREP BOWEL PREP KIT 17.5-3.13-1.6 GM/177ML SOLN, See admin instructions., Disp: , Rfl:  .  pregabalin (LYRICA) 50 MG capsule, Take 1 capsule (50 mg total) by mouth 3 (three) times daily., Disp: 90 capsule, Rfl: 0  Allergies  Allergen Reactions  . Biaxin  [Clarithromycin] Other (See Comments)  . Bupropion Hcl Diarrhea  . Darvocet [Propoxyphene N-Acetaminophen] Nausea Only    I personally  reviewed active problem list, medication list, allergies, family history, social history with the patient/caregiver today.   ROS  Ten systems reviewed and is negative except as mentioned in HPI   Objective  Virtual encounter, vitals not obtained.  There is no height or weight on file to calculate BMI.  Physical Exam  Awake, alert and oriented   PHQ2/9: Depression screen [facility] 2/9 05/24/2019 02/15/2019 10/12/2018 04/19/2018 01/18/2018  Decreased Interest 0 1 0 0 0  Down, Depressed, Hopeless 1 1 0 1 0  PHQ - 2 Score 1 2 0 1 0  Altered sleeping 0 '1 3 3 3  ' Tired, decreased energy 0 0 0 1 0  Change in appetite 1 1 0 1 0  Feeling bad or failure about yourself  0 0 0 0 1  Trouble concentrating 0 0 0 1 0  Moving slowly or fidgety/restless 0 1 0 2 0  Suicidal thoughts 0 0 0 0 0  PHQ-9 Score '2 5 3 9 4  ' Difficult doing work/chores Somewhat difficult Somewhat difficult Somewhat difficult - -   PHQ-2/9 Result is negative.    Fall Risk: Fall Risk  05/24/2019 02/15/2019 10/12/2018 05/22/2017 05/28/2016  Falls in the past year? 0 0 0 No No  Number falls in past yr: 0 0 0 - -  Injury with Fall? 0 0 0 - -  Comment - - - - -    Assessment & Plan  1. Paresthesia of both feet  Discussed b12 supplementation, she asked for something else since bothering her sleep, we will try adding lyrica to titrate up slowly and see if controls  symptoms   2. B12 deficiency   3. Vitamin D deficiency   4. Dyslipidemia  - lovastatin (MEVACOR) 20 MG tablet; Take 1 tablet (20 mg total) by mouth daily. TAKE 1 TABLET BY MOUTH EVERY DAY FOR CHOLESTEROL  Dispense: 90 tablet; Refill: 0  5. Depression, major, recurrent, mild ([place])   6. Arthralgia of multiple joints  - ANA,IFA RA Diag Pnl w/rflx Tit/Patn  7. Generalized anxiety disorder  - ALPRAZolam (XANAX) 0.5 MG tablet; Take 1 tablet (0.5 mg total) by mouth daily as needed.  Dispense: 30 tablet; Refill: 0  I discussed the assessment and treatment plan with the patient. The patient was provided an opportunity to ask questions and all were answered. The patient agreed with the plan and demonstrated an understanding of the instructions.  The patient was advised to call back or seek an in-person evaluation if the symptoms worsen or if the condition fails to improve as anticipated.  I provided 25  minutes of non-face-to-face time during this encounter.

## 2019-05-26 ENCOUNTER — Telehealth: Payer: Self-pay | Admitting: Gastroenterology

## 2019-05-26 NOTE — Telephone Encounter (Signed)
Pt left vm she is supposed to go to her Covid test tomorrow her son who is 18 has just been diagnosed with Covid she has not seen him since 05/15/19 and that was outside. She is calling to see what she should do please call pt

## 2019-05-26 NOTE — Telephone Encounter (Signed)
Patient has been advised that if she is not having any symptoms she should go for her COVID testing as required for her colonoscopy.  If her test is positive we will reschedule her colonoscopy and if its negative everything remains as scheduled.  She states her son would not be the one bringing her to her colonoscopy and she has not been around him since 05/15/19.  I've asked her to call the office back if she decides she would like to reschedule.  Thanks [employer]

## 2019-05-27 ENCOUNTER — Other Ambulatory Visit: Payer: Self-pay

## 2019-05-27 ENCOUNTER — Other Ambulatory Visit
Admission: RE | Admit: 2019-05-27 | Discharge: 2019-05-27 | Disposition: A | Discharge status: Home / Self Care | Payer: BC Managed Care – PPO | Source: Ambulatory Visit | Attending: Gastroenterology | Admitting: Gastroenterology

## 2019-05-27 DIAGNOSIS — Z01812 Encounter for preprocedural laboratory examination: Secondary | ICD-10-CM | POA: Insufficient documentation

## 2019-05-27 DIAGNOSIS — Z20828 Contact with and (suspected) exposure to other viral communicable diseases: Secondary | ICD-10-CM | POA: Insufficient documentation

## 2019-05-27 LAB — SARS CORONAVIRUS 2 (TAT 6-24 HRS): SARS Coronavirus 2: NEGATIVE

## 2019-05-30 MED ORDER — SODIUM CHLORIDE 0.9 % IV SOLN
INTRAVENOUS | Status: DC
  Administered 2019-05-31 (×2): via INTRAVENOUS

## 2019-05-31 ENCOUNTER — Ambulatory Visit: Payer: BC Managed Care – PPO | Admitting: Certified Registered"

## 2019-05-31 ENCOUNTER — Other Ambulatory Visit: Payer: Self-pay

## 2019-05-31 ENCOUNTER — Encounter
Admission: RE | Disposition: A | Discharge status: Home / Self Care | Payer: Self-pay | Source: Home / Self Care | Attending: Gastroenterology

## 2019-05-31 ENCOUNTER — Encounter: Payer: Self-pay | Admitting: *Deleted

## 2019-05-31 ENCOUNTER — Ambulatory Visit
Admission: RE | Admit: 2019-05-31 | Discharge: 2019-05-31 | Disposition: A | Discharge status: Home / Self Care | Payer: BC Managed Care – PPO | Attending: Gastroenterology | Admitting: Gastroenterology

## 2019-05-31 DIAGNOSIS — E785 Hyperlipidemia, unspecified: Secondary | ICD-10-CM | POA: Insufficient documentation

## 2019-05-31 DIAGNOSIS — Z881 Allergy status to other antibiotic agents status: Secondary | ICD-10-CM | POA: Insufficient documentation

## 2019-05-31 DIAGNOSIS — F419 Anxiety disorder, unspecified: Secondary | ICD-10-CM | POA: Insufficient documentation

## 2019-05-31 DIAGNOSIS — F329 Major depressive disorder, single episode, unspecified: Secondary | ICD-10-CM | POA: Insufficient documentation

## 2019-05-31 DIAGNOSIS — Z87891 Personal history of nicotine dependence: Secondary | ICD-10-CM | POA: Insufficient documentation

## 2019-05-31 DIAGNOSIS — E538 Deficiency of other specified B group vitamins: Secondary | ICD-10-CM | POA: Diagnosis not present

## 2019-05-31 DIAGNOSIS — Z79899 Other long term (current) drug therapy: Secondary | ICD-10-CM | POA: Insufficient documentation

## 2019-05-31 DIAGNOSIS — Z1211 Encounter for screening for malignant neoplasm of colon: Secondary | ICD-10-CM | POA: Diagnosis not present

## 2019-05-31 DIAGNOSIS — D124 Benign neoplasm of descending colon: Secondary | ICD-10-CM | POA: Insufficient documentation

## 2019-05-31 DIAGNOSIS — G2581 Restless legs syndrome: Secondary | ICD-10-CM | POA: Diagnosis not present

## 2019-05-31 DIAGNOSIS — Z860100 Personal history of colon polyps, unspecified: Secondary | ICD-10-CM

## 2019-05-31 DIAGNOSIS — Z6828 Body mass index (BMI) 28.0-28.9, adult: Secondary | ICD-10-CM | POA: Insufficient documentation

## 2019-05-31 DIAGNOSIS — Z885 Allergy status to narcotic agent status: Secondary | ICD-10-CM | POA: Diagnosis not present

## 2019-05-31 DIAGNOSIS — E663 Overweight: Secondary | ICD-10-CM | POA: Insufficient documentation

## 2019-05-31 DIAGNOSIS — Z888 Allergy status to other drugs, medicaments and biological substances status: Secondary | ICD-10-CM | POA: Diagnosis not present

## 2019-05-31 DIAGNOSIS — K641 Second degree hemorrhoids: Secondary | ICD-10-CM | POA: Insufficient documentation

## 2019-05-31 DIAGNOSIS — K635 Polyp of colon: Secondary | ICD-10-CM | POA: Diagnosis not present

## 2019-05-31 HISTORY — PX: COLONOSCOPY WITH PROPOFOL: SHX5780

## 2019-05-31 SURGERY — COLONOSCOPY WITH PROPOFOL
Anesthesia: General

## 2019-05-31 MED ORDER — LIDOCAINE HCL (CARDIAC) PF 100 MG/5ML IV SOSY
PREFILLED_SYRINGE | INTRAVENOUS | Status: DC | PRN
  Administered 2019-05-31: 100 mg via INTRATRACHEAL

## 2019-05-31 MED ORDER — PROPOFOL 500 MG/50ML IV EMUL
INTRAVENOUS | Status: DC | PRN
  Administered 2019-05-31: 160 ug/kg/min via INTRAVENOUS

## 2019-05-31 MED ORDER — PHENYLEPHRINE HCL (PRESSORS) 10 MG/ML IV SOLN
INTRAVENOUS | Status: DC | PRN
  Administered 2019-05-31: 100 ug via INTRAVENOUS

## 2019-05-31 MED ORDER — PROPOFOL 10 MG/ML IV BOLUS
INTRAVENOUS | Status: DC | PRN
  Administered 2019-05-31: 60 mg via INTRAVENOUS
  Administered 2019-05-31: 10 mg via INTRAVENOUS
  Administered 2019-05-31: 20 mg via INTRAVENOUS

## 2019-05-31 NOTE — Transfer of Care (Signed)
Immediate Anesthesia Transfer of Care Note  Patient: Cynthia Bean  Procedure(s) Performed: COLONOSCOPY WITH PROPOFOL (N/A )  Patient Location: Endoscopy Unit  Anesthesia Type:General  Level of Consciousness: drowsy, pateint uncooperative and responds to stimulation  Airway & Oxygen Therapy: Patient Spontanous Breathing and Patient connected to face mask oxygen  Post-op Assessment: Report given to RN and Post -op Vital signs reviewed and stable  Post vital signs: Reviewed and stable  Last Vitals:  Vitals Value Taken Time  BP 89/58 05/31/19 0918  Temp 36.4 C 05/31/19 0918  Pulse 65 05/31/19 0919  Resp 16 05/31/19 0919  SpO2 98 % 05/31/19 0919  Vitals shown include unvalidated device data.  Last Pain:  Vitals:   05/31/19 0918  TempSrc: Temporal  PainSc: Asleep         Complications: No apparent anesthesia complications

## 2019-05-31 NOTE — Anesthesia Preprocedure Evaluation (Signed)
Anesthesia Evaluation  Patient identified by MRN, date of birth, ID band Patient awake    Reviewed: Allergy & Precautions, H&P , NPO status , Patient's Chart, lab work & pertinent test results  History of Anesthesia Complications (+) PROLONGED EMERGENCE and history of anesthetic complications  Airway Mallampati: III  TM Distance: >3 FB Neck ROM: full    Dental  (+) Chipped, Poor Dentition   Pulmonary neg shortness of breath, former smoker,           Cardiovascular      Neuro/Psych PSYCHIATRIC DISORDERS  Neuromuscular disease    GI/Hepatic negative GI ROS, Neg liver ROS, neg GERD  ,  Endo/Other  negative endocrine ROS  Renal/GU negative Renal ROS  negative genitourinary   Musculoskeletal   Abdominal   Peds  Hematology negative hematology ROS (+)   Anesthesia Other Findings Past Medical History: No date: Allergy No date: Anxiety No date: Depression No date: Hyperglycemia No date: Hyperlipidemia No date: Irritable bowel syndrome (IBS) No date: Menopausal state No date: Neck pain No date: Overweight No date: Palpitations No date: Pituitary tumor No date: RLS (restless legs syndrome) No date: Tobacco use disorder No date: Vitamin B12 deficiency No date: Vitamin D deficiency  Past Surgical History: No date: TONSILECTOMY, ADENOIDECTOMY, BILATERAL MYRINGOTOMY AND TUBES No date: TUBAL LIGATION No date: WISDOM TOOTH EXTRACTION     Comment:  patient stated that she now has a dry socket  BMI    Body Mass Index: 28.89 kg/m      Reproductive/Obstetrics negative OB ROS                             Anesthesia Physical Anesthesia Plan  ASA: III  Anesthesia Plan: General   Post-op Pain Management:    Induction: Intravenous  PONV Risk Score and Plan: Propofol infusion and TIVA  Airway Management Planned: Natural Airway and Nasal Cannula  Additional Equipment:   Intra-op Plan:    Post-operative Plan:   Informed Consent: I have reviewed the patients History and Physical, chart, labs and discussed the procedure including the risks, benefits and alternatives for the proposed anesthesia with the patient or authorized representative who has indicated his/her understanding and acceptance.     Dental Advisory Given  Plan Discussed with: Anesthesiologist, CRNA and Surgeon  Anesthesia Plan Comments: (Patient consented for risks of anesthesia including but not limited to:  - adverse reactions to medications - risk of intubation if required - damage to teeth, lips or other oral mucosa - sore throat or hoarseness - Damage to heart, brain, lungs or loss of life  Patient voiced understanding.)        Anesthesia Quick Evaluation

## 2019-05-31 NOTE — Anesthesia Post-op Follow-up Note (Signed)
Anesthesia QCDR form completed.        

## 2019-05-31 NOTE — Op Note (Signed)
[facility] Gastroenterology Patient Name: Cynthia Bean Procedure Date: 05/31/2019 8:28 AM MRN: [id number] Account #: [id number] Date of Birth: 10-29-1965 Admit Type: Outpatient Age: 53 Room: [facility] ENDO ROOM 4 Gender: Female Note Status: Finalized Procedure:             Colonoscopy Indications:           High risk colon cancer surveillance: Personal history                         of colonic polyps Providers:             Lucilla Lame MD, MD Referring MD:          Bethena Roys. Sowles, MD (Referring MD) Medicines:             Propofol per Anesthesia Complications:         No immediate complications. Procedure:             Pre-Anesthesia Assessment:                        - Prior to the procedure, a History and Physical was                         performed, and patient medications and allergies were                         reviewed. The patient's tolerance of previous                         anesthesia was also reviewed. The risks and benefits                         of the procedure and the sedation options and risks                         were discussed with the patient. All questions were                         answered, and informed consent was obtained. Prior                         Anticoagulants: The patient has taken no previous                         anticoagulant or antiplatelet agents. ASA Grade                         Assessment: II - A patient with mild systemic disease.                         After reviewing the risks and benefits, the patient                         was deemed in satisfactory condition to undergo the                         procedure.  After obtaining informed consent, the colonoscope was                         passed under direct vision. Throughout the procedure,                         the patient's blood pressure, pulse, and oxygen                         saturations were monitored continuously. The                          Colonoscope was introduced through the anus and                         advanced to the the cecum, identified by appendiceal                         orifice and ileocecal valve. The colonoscopy was                         performed without difficulty. The patient tolerated                         the procedure well. The quality of the bowel                         preparation was excellent. Findings:      The perianal and digital rectal examinations were normal.      Three sessile polyps were found in the descending colon. The polyps were       4 to 5 mm in size. These polyps were removed with a cold snare.       Resection and retrieval were complete.      Non-bleeding internal hemorrhoids were found during retroflexion. The       hemorrhoids were Grade II (internal hemorrhoids that prolapse but reduce       spontaneously). Impression:            - Three 4 to 5 mm polyps in the descending colon,                         removed with a cold snare. Resected and retrieved.                        - Non-bleeding internal hemorrhoids. Recommendation:        - Discharge patient to home.                        - Resume previous diet.                        - Continue present medications.                        - Await pathology results.                        - Repeat colonoscopy in 5 years for surveillance. Procedure Code(s):     --- Professional ---  45385, Colonoscopy, flexible; with removal of                         tumor(s), polyp(s), or other lesion(s) by snare                         technique Diagnosis Code(s):     --- Professional ---                        Z86.010, Personal history of colonic polyps                        K63.5, Polyp of colon CPT copyright 2019 American Medical Association. All rights reserved. The codes documented in this report are preliminary and upon coder review may  be revised to meet current compliance  requirements. Lucilla Lame MD, MD 05/31/2019 9:15:58 AM This report has been signed electronically. Number of Addenda: 0 Note Initiated On: 05/31/2019 8:28 AM Scope Withdrawal Time: 0 hours 13 minutes 3 seconds  Total Procedure Duration: 0 hours 16 minutes 36 seconds  Estimated Blood Loss:  Estimated blood loss: none.      [facility]

## 2019-05-31 NOTE — Anesthesia Postprocedure Evaluation (Signed)
Anesthesia Post Note  Patient: Cynthia Bean  Procedure(s) Performed: COLONOSCOPY WITH PROPOFOL (N/A )  Patient location during evaluation: Endoscopy Anesthesia Type: General Level of consciousness: awake and alert Pain management: pain level controlled Vital Signs Assessment: post-procedure vital signs reviewed and stable Respiratory status: spontaneous breathing, nonlabored ventilation, respiratory function stable and patient connected to nasal cannula oxygen Cardiovascular status: blood pressure returned to baseline and stable Postop Assessment: no apparent nausea or vomiting Anesthetic complications: no     Last Vitals:  Vitals:   05/31/19 0829 05/31/19 0918  BP: (!) 98/58 (!) 89/58  Pulse: 78 66  Resp:  14  Temp: (!) 36.2 C 36.4 C  SpO2: 98% 98%    Last Pain:  Vitals:   05/31/19 0948  TempSrc:   PainSc: 0-No pain                 Precious Haws Tiwanda Threats

## 2019-05-31 NOTE — H&P (Signed)
Lucilla Lame, MD [address] [place], [place] [address] Phone:[phone] Fax : [phone]  Primary Care Physician:  Steele Sizer, MD Primary Gastroenterologist:  Dr. Allen Norris  Pre-Procedure History & Physical: HPI:  Cynthia Bean is a 53 y.o. female is here for an colonoscopy.   Past Medical History:  Diagnosis Date  . Allergy   . Anxiety   . Depression   . Hyperglycemia   . Hyperlipidemia   . Irritable bowel syndrome (IBS)   . Menopausal state   . Neck pain   . Overweight   . Palpitations   . Pituitary tumor   . RLS (restless legs syndrome)   . Tobacco use disorder   . Vitamin B12 deficiency   . Vitamin D deficiency     Past Surgical History:  Procedure Laterality Date  . TONSILECTOMY, ADENOIDECTOMY, BILATERAL MYRINGOTOMY AND TUBES    . TUBAL LIGATION    . WISDOM TOOTH EXTRACTION     patient stated that she now has a dry socket    Prior to Admission medications   Medication Sig Start Date End Date Taking? Authorizing Provider  ALPRAZolam Duanne Moron) 0.5 MG tablet Take 1 tablet (0.5 mg total) by mouth daily as needed. 05/24/19  Yes Sowles, Drue Stager, MD  Cyanocobalamin (B-12) 1000 MCG SUBL Place 1 tablet under the tongue every other day.  06/19/14  Yes [provider]  escitalopram (LEXAPRO) 20 MG tablet Take 1 tablet (20 mg total) by mouth daily. 02/15/19  Yes Sowles, Drue Stager, MD  Ginger, Zingiber officinalis, (GINGER PO) Take 12 mg by mouth daily.   Yes [provider]  lovastatin (MEVACOR) 20 MG tablet Take 1 tablet (20 mg total) by mouth daily. TAKE 1 TABLET BY MOUTH EVERY DAY FOR CHOLESTEROL 05/24/19  Yes Sowles, Drue Stager, MD  pregabalin (LYRICA) 50 MG capsule Take 1 capsule (50 mg total) by mouth 3 (three) times daily. 05/24/19  Yes Sowles, Drue Stager, MD  fluticasone [facility]) 50 MCG/ACT nasal spray SPRAY 2 SPRAYS INTO EACH NOSTRIL EVERY DAY 01/05/19   Ancil Boozer, Drue Stager, MD  loratadine (CLARITIN) 10 MG tablet TAKE 1 TABLET BY MOUTH EVERY  DAY 04/11/19   Steele Sizer, MD  SUPREP BOWEL PREP KIT 17.5-3.13-1.6 GM/177ML SOLN See admin instructions. 05/16/19   [provider]    Allergies as of 05/05/2019 - Review Complete 02/15/2019  Allergen Reaction Noted  . Biaxin  [clarithromycin] Other (See Comments) 05/31/2015  . Bupropion hcl Diarrhea 05/31/2015  . Darvocet [propoxyphene n-acetaminophen] Nausea Only 04/13/2013    Family History  Problem Relation Age of Onset  . Hyperlipidemia Mother   . Hypertension Mother   . Thyroid disease Mother   . Kidney disease Mother   . Colon cancer Mother   . Diabetes Father   . Hypertension Father   . CAD Father   . Stroke Father   . ADD / ADHD Son   . Breast cancer Maternal Grandmother     Social History   Socioeconomic History  . Marital status: Married    Spouse name: Fritz Pickerel  . Number of children: 2  . Years of education: Not on file  . Highest education level: Some college, no degree  Occupational History  . Occupation: real estate   Social Needs  . Financial resource strain: Not hard at all  . Food insecurity    Worry: Never true    Inability: Never true  . Transportation needs    Medical: No    Non-medical: No  Tobacco Use  .  Smoking status: Former Smoker    Packs/day: 0.50    Years: 30.00    Pack years: 15.00    Types: Cigarettes  . Smokeless tobacco: Former Systems developer    Quit date: 08/06/2016  . Tobacco comment: switched to e-cigarettes  Substance and Sexual Activity  . Alcohol use: Yes    Comment: occasional  . Drug use: No  . Sexual activity: Yes    Partners: Male  Lifestyle  . Physical activity    Days per week: 7 days    Minutes per session: 30 min  . Stress: To some extent  Relationships  . Social connections    Talks on phone: More than three times a week    Gets together: More than three times a week    Attends religious service: Never    Active member of club or organization: No    Attends meetings of clubs or organizations: Never     Relationship status: Married  . Intimate partner violence    Fear of current or ex partner: No    Emotionally abused: No    Physically abused: No    Forced sexual activity: No  Other Topics Concern  . Not on file  Social History Narrative   Married      Patient has 6 grandkids and a mini farm    Review of Systems: See HPI, otherwise negative ROS  Physical Exam: BP (!) 98/58   Pulse 78   Temp (!) 97.2 F (36.2 C) (Temporal)   Ht '5\' 8"'  (1.727 m)   Wt 86.2 kg   SpO2 98%   BMI 28.89 kg/m  General:   Alert,  pleasant and cooperative in NAD Head:  Normocephalic and atraumatic. Neck:  Supple; no masses or thyromegaly. Lungs:  Clear throughout to auscultation.    Heart:  Regular rate and rhythm. Abdomen:  Soft, nontender and nondistended. Normal bowel sounds, without guarding, and without rebound.   Neurologic:  Alert and  oriented x4;  grossly normal neurologically.  Impression/Plan: Cynthia Bean is here for an colonoscopy to be performed for history of adenomatous polyps 05/29/2014  Risks, benefits, limitations, and alternatives regarding  colonoscopy have been reviewed with the patient.  Questions have been answered.  All parties agreeable.   Lucilla Lame, MD  05/31/2019, 8:36 AM

## 2019-06-01 ENCOUNTER — Encounter: Payer: Self-pay | Admitting: Gastroenterology

## 2019-06-01 LAB — SURGICAL PATHOLOGY

## 2019-06-10 ENCOUNTER — Encounter: Payer: Self-pay | Admitting: Family Medicine

## 2019-06-27 ENCOUNTER — Ambulatory Visit: Payer: BC Managed Care – PPO

## 2019-06-27 ENCOUNTER — Other Ambulatory Visit: Payer: Self-pay

## 2019-07-01 ENCOUNTER — Encounter: Payer: Self-pay | Admitting: Family Medicine

## 2019-08-03 ENCOUNTER — Other Ambulatory Visit: Payer: Self-pay | Admitting: Family Medicine

## 2019-08-03 DIAGNOSIS — E785 Hyperlipidemia, unspecified: Secondary | ICD-10-CM

## 2019-08-11 ENCOUNTER — Encounter: Payer: Self-pay | Admitting: Family Medicine

## 2019-08-17 ENCOUNTER — Ambulatory Visit (INDEPENDENT_AMBULATORY_CARE_PROVIDER_SITE_OTHER): Payer: BC Managed Care – PPO | Admitting: Family Medicine

## 2019-08-17 ENCOUNTER — Encounter: Payer: Self-pay | Admitting: Family Medicine

## 2019-08-17 VITALS — BP 106/55 | HR 73 | Wt 190.0 lb

## 2019-08-17 DIAGNOSIS — E785 Hyperlipidemia, unspecified: Secondary | ICD-10-CM

## 2019-08-17 DIAGNOSIS — F330 Major depressive disorder, recurrent, mild: Secondary | ICD-10-CM | POA: Diagnosis not present

## 2019-08-17 DIAGNOSIS — E538 Deficiency of other specified B group vitamins: Secondary | ICD-10-CM

## 2019-08-17 DIAGNOSIS — E559 Vitamin D deficiency, unspecified: Secondary | ICD-10-CM | POA: Diagnosis not present

## 2019-08-17 DIAGNOSIS — D352 Benign neoplasm of pituitary gland: Secondary | ICD-10-CM

## 2019-08-17 DIAGNOSIS — N950 Postmenopausal bleeding: Secondary | ICD-10-CM

## 2019-08-17 DIAGNOSIS — F411 Generalized anxiety disorder: Secondary | ICD-10-CM

## 2019-08-17 MED ORDER — VITAMIN D 50 MCG (2000 UT) PO CAPS: 1.0000 | ORAL_CAPSULE | Freq: Every day | ORAL | 0 refills | Status: AC

## 2019-08-17 MED ORDER — ESCITALOPRAM OXALATE 20 MG PO TABS: 20.0000 mg | ORAL_TABLET | Freq: Every day | ORAL | 1 refills | Status: DC

## 2019-08-17 MED ORDER — LOVASTATIN 20 MG PO TABS: 20.0000 mg | ORAL_TABLET | ORAL | 1 refills | Status: DC

## 2019-08-17 MED ORDER — ALPRAZOLAM 0.5 MG PO TABS: 0.5000 mg | ORAL_TABLET | Freq: Every day | ORAL | 0 refills | Status: DC | PRN

## 2019-08-17 NOTE — Progress Notes (Signed)
Name: Cynthia Bean   MRN: [id number]    DOB: [birth year]   Date:08/17/2019       Progress Note  Subjective  Chief Complaint  Chief Complaint  Patient presents with  . Medication Refill  . Depression  . GAD  . Hyperlipidemia  . Hyperglycemia  . B12 deficiency    I connected with  Roselind L Longnecker  on 08/17/19 at  1:20 PM EST by a video enabled telemedicine application and verified that I am speaking with the correct person using two identifiers.  I discussed the limitations of evaluation and management by telemedicine and the availability of in person appointments. The patient expressed understanding and agreed to proceed. Staff also discussed with the patient that there may be a patient responsible charge related to this service. Patient Location: at home Provider Location: [facility]   HPI  GAD:she is taking alprazolam prn, she takes Lexapro daily , but she states she is feeling more stressed, caregiver for her mother, worries about her kids.   B12 deficiency: last B12 was low on her last labs and she is taking higher dose of otc supplementation, no paresthesias  Foot pain: she has seen podiatrist in the past for the left foot in the past ( plantar fascia tear) now right pain is worse and feels stiff, she does not want to go back because of cost  Trigger finger: she has also noticed hands are stiff all day long, and worse at night. Discussed labs for inflammatory arthritis , it was ordered last Fall but it was not done  Major Depression/GADsheis doing well on Lexapro,phq9is a little back down to 3, from 5. No suicidal thoughts or ideation.She is compliant with medication  Hyperlipidemia: takinglovastatin only three times weekly and last LDL was above 130, she is back taking it daily and we will recheck labs on her next visit   Hyperglycemia: she denies polyphagia, polydipsia or polyuria, last glucose at goal.Normal A1C in August 2020 also normal  glucose .  Prolactinoma : diagnosed in 2008 at [facility], she was seeing Oncologist Mamie Levers ) at [facility], took medication for one year - Dostinex , but stopped on her own.Last MRI showed stable to smaller microadenoma, prolactin also back to normal and last level trending down.She denies galactorrhea, she very seldom has headaches now.   Post-menopausal bleeding: she states that she developed breast tenderness for a couple of days followed by vaginal bleeding, that was red and lasted a couple of days, explained importance of seeing gyn to rule out polyps or endometrium cancer   Patient Active Problem List   Diagnosis Date Noted  . Personal history of colonic polyps   . Polyp of descending colon   . Hand and foot pain 03/15/2019  . History of uterine fibroid 02/15/2019  . Acquired trigger finger of right middle finger 04/27/2018  . Trigger finger, right middle finger 04/19/2018  . Dysfunction of both eustachian tubes 04/19/2018  . Chronic left lumbar radiculopathy 04/19/2018  . ASCUS of cervix with negative high risk HPV 04/10/2017  . Abnormal uterine bleeding (AUB) 02/27/2016  . Uterine fibroid 02/27/2016  . Generalized anxiety disorder 09/20/2015  . Allergic rhinitis 05/31/2015  . Depression, major, recurrent, mild ([place]) 05/31/2015  . Dyslipidemia 05/31/2015  . Blood glucose elevated 05/31/2015  . Irritable bowel syndrome with diarrhea 05/31/2015  . Female climacteric state 05/31/2015  . Pituitary adenoma ([place]) 05/31/2015  . Restless legs syndrome 05/31/2015  . B12 deficiency 05/31/2015  .  Epicondylitis, lateral (tennis elbow) 05/31/2015  . Fracture, toe 04/13/2013  . Tobacco use 05/03/2009  . Vitamin D deficiency 05/03/2009    Past Surgical History:  Procedure Laterality Date  . COLONOSCOPY WITH PROPOFOL N/A 05/31/2019   Procedure: COLONOSCOPY WITH PROPOFOL;  Surgeon: Lucilla Lame, MD;  [facility] ENDOSCOPY;  Service: Endoscopy;  Laterality: N/A;  .  TONSILECTOMY, ADENOIDECTOMY, BILATERAL MYRINGOTOMY AND TUBES    . TUBAL LIGATION    . WISDOM TOOTH EXTRACTION     patient stated that she now has a dry socket    Family History  Problem Relation Age of Onset  . Hyperlipidemia Mother   . Hypertension Mother   . Thyroid disease Mother   . Kidney disease Mother   . Colon cancer Mother   . Diabetes Father   . Hypertension Father   . CAD Father   . Stroke Father   . ADD / ADHD Son   . Breast cancer Maternal Grandmother      Current Outpatient Medications:  .  ALPRAZolam (XANAX) 0.5 MG tablet, Take 1 tablet (0.5 mg total) by mouth daily as needed., Disp: 30 tablet, Rfl: 0 .  Cyanocobalamin (B-12) 1000 MCG SUBL, Place 1 tablet under the tongue every other day. , Disp: , Rfl:  .  escitalopram (LEXAPRO) 20 MG tablet, Take 1 tablet (20 mg total) by mouth daily., Disp: 90 tablet, Rfl: 1 .  fluticasone (FLONASE) 50 MCG/ACT nasal spray, SPRAY 2 SPRAYS INTO EACH NOSTRIL EVERY DAY, Disp: 48 mL, Rfl: 0 .  Ginger, Zingiber officinalis, (GINGER PO), Take 12 mg by mouth daily., Disp: , Rfl:  .  loratadine (CLARITIN) 10 MG tablet, TAKE 1 TABLET BY MOUTH EVERY DAY, Disp: 90 tablet, Rfl: 1 .  lovastatin (MEVACOR) 20 MG tablet, TAKE 1 TABLET BY MOUTH EVERY OTHER DAY, Disp: 45 tablet, Rfl: 1  Allergies  Allergen Reactions  . Biaxin  [Clarithromycin] Other (See Comments)  . Bupropion Hcl Diarrhea  . Darvocet [Propoxyphene N-Acetaminophen] Nausea Only    I personally reviewed active problem list, medication list, allergies, family history, social history, health maintenance with the patient/caregiver today.   ROS  Ten systems reviewed and is negative except as mentioned in HPI   Objective  Virtual encounter, vitals not obtained.  Body mass index is 28.89 kg/m.  Physical Exam  Awake, alert and oriented  PHQ2/9: Depression screen [facility] 2/9 08/17/2019 05/24/2019 02/15/2019 10/12/2018 04/19/2018  Decreased Interest 0 0 1 0 0  Down, Depressed,  Hopeless 0 1 1 0 1  PHQ - 2 Score 0 1 2 0 1  Altered sleeping 0 0 1 3 3   Tired, decreased energy 0 0 0 0 1  Change in appetite 0 1 1 0 1  Feeling bad or failure about yourself  0 0 0 0 0  Trouble concentrating 0 0 0 0 1  Moving slowly or fidgety/restless 0 0 1 0 2  Suicidal thoughts 0 0 0 0 0  PHQ-9 Score 0 2 5 3 9   Difficult doing work/chores Not difficult at all Somewhat difficult Somewhat difficult Somewhat difficult -   PHQ-2/9 Result is negative  GAD 7 : Generalized Anxiety Score 08/17/2019 10/12/2018 04/19/2018 01/18/2018  Nervous, Anxious, on Edge 3 3 3 3   Control/stop worrying 3 0 1 0  Worry too much - different things 3 1 2 1   Trouble relaxing 3 3 3 3   Restless 3 3 3 3   Easily annoyed or irritable 1 1 2 1   Afraid - awful  might happen 1 3 2 3   Total GAD 7 Score 17 14 16 14   Anxiety Difficulty Very difficult Somewhat difficult - Very difficult     Fall Risk: Fall Risk  05/24/2019 02/15/2019 10/12/2018 05/22/2017 05/28/2016  Falls in the past year? 0 0 0 No No  Number falls in past yr: 0 0 0 - -  Injury with Fall? 0 0 0 - -  Comment - - - - -     Assessment & Plan  1. Dyslipidemia  - lovastatin (MEVACOR) 20 MG tablet; Take 1 tablet (20 mg total) by mouth every other day.  Dispense: 90 tablet; Refill: 1  2. B12 deficiency  Continue supplementation  3. Vitamin D deficiency  - Cholecalciferol (VITAMIN D) 50 MCG (2000 UT) CAPS; Take 1 capsule (2,000 Units total) by mouth daily.  Dispense: 30 capsule; Refill: 0  4. Depression, major, recurrent, mild (HCC)  - escitalopram (LEXAPRO) 20 MG tablet; Take 1 tablet (20 mg total) by mouth daily.  Dispense: 90 tablet; Refill: 1  5. Prolactinoma ([place])  Last level was normal   6. Generalized anxiety disorder  - escitalopram (LEXAPRO) 20 MG tablet; Take 1 tablet (20 mg total) by mouth daily.  Dispense: 90 tablet; Refill: 1 - ALPRAZolam (XANAX) 0.5 MG tablet; Take 1 tablet (0.5 mg total) by mouth daily as needed.  Dispense: 30  tablet; Refill: 0  Discussed CBT or therapy   7. Post-menopausal bleeding  - Ambulatory referral to Obstetrics / Gynecology  I discussed the assessment and treatment plan with the patient. The patient was provided an opportunity to ask questions and all were answered. The patient agreed with the plan and demonstrated an understanding of the instructions.  The patient was advised to call back or seek an in-person evaluation if the symptoms worsen or if the condition fails to improve as anticipated.  I provided 25  minutes of non-face-to-face time during this encounter.

## 2019-08-30 DIAGNOSIS — N950 Postmenopausal bleeding: Secondary | ICD-10-CM | POA: Diagnosis not present

## 2019-08-30 DIAGNOSIS — N858 Other specified noninflammatory disorders of uterus: Secondary | ICD-10-CM | POA: Diagnosis not present

## 2019-08-30 DIAGNOSIS — D259 Leiomyoma of uterus, unspecified: Secondary | ICD-10-CM | POA: Diagnosis not present

## 2019-09-12 DIAGNOSIS — N950 Postmenopausal bleeding: Secondary | ICD-10-CM | POA: Diagnosis not present

## 2019-09-27 ENCOUNTER — Other Ambulatory Visit: Payer: Self-pay | Admitting: Family Medicine

## 2019-09-27 ENCOUNTER — Encounter: Payer: Self-pay | Admitting: Family Medicine

## 2019-11-16 ENCOUNTER — Ambulatory Visit (INDEPENDENT_AMBULATORY_CARE_PROVIDER_SITE_OTHER): Payer: BC Managed Care – PPO | Admitting: Family Medicine

## 2019-11-16 ENCOUNTER — Other Ambulatory Visit: Payer: Self-pay

## 2019-11-16 ENCOUNTER — Encounter: Payer: Self-pay | Admitting: Family Medicine

## 2019-11-16 VITALS — BP 110/68 | HR 83 | Temp 97.5°F | Resp 16 | Ht 68.0 in | Wt 194.3 lb

## 2019-11-16 DIAGNOSIS — M25532 Pain in left wrist: Secondary | ICD-10-CM

## 2019-11-16 DIAGNOSIS — F330 Major depressive disorder, recurrent, mild: Secondary | ICD-10-CM

## 2019-11-16 DIAGNOSIS — E785 Hyperlipidemia, unspecified: Secondary | ICD-10-CM

## 2019-11-16 DIAGNOSIS — F411 Generalized anxiety disorder: Secondary | ICD-10-CM

## 2019-11-16 DIAGNOSIS — W57XXXA Bitten or stung by nonvenomous insect and other nonvenomous arthropods, initial encounter: Secondary | ICD-10-CM

## 2019-11-16 DIAGNOSIS — M25531 Pain in right wrist: Secondary | ICD-10-CM

## 2019-11-16 DIAGNOSIS — S40869A Insect bite (nonvenomous) of unspecified upper arm, initial encounter: Secondary | ICD-10-CM

## 2019-11-16 DIAGNOSIS — R079 Chest pain, unspecified: Secondary | ICD-10-CM | POA: Diagnosis not present

## 2019-11-16 DIAGNOSIS — Z79899 Other long term (current) drug therapy: Secondary | ICD-10-CM

## 2019-11-16 DIAGNOSIS — E538 Deficiency of other specified B group vitamins: Secondary | ICD-10-CM

## 2019-11-16 MED ORDER — TRIAMCINOLONE ACETONIDE 0.1 % EX CREA: 1.0000 "application " | TOPICAL_CREAM | Freq: Two times a day (BID) | CUTANEOUS | 0 refills | Status: DC

## 2019-11-16 MED ORDER — ALPRAZOLAM 0.5 MG PO TABS: 0.5000 mg | ORAL_TABLET | Freq: Every day | ORAL | 1 refills | Status: DC | PRN

## 2019-11-16 MED ORDER — BUSPIRONE HCL 5 MG PO TABS: 5.0000 mg | ORAL_TABLET | Freq: Three times a day (TID) | ORAL | 0 refills | Status: DC | PRN

## 2019-11-16 MED ORDER — DICLOFENAC SODIUM 1 % EX GEL: 2.0000 g | Freq: Four times a day (QID) | CUTANEOUS | 0 refills | Status: DC

## 2019-11-16 NOTE — Progress Notes (Signed)
Name: Cynthia Bean   MRN: [id number]    DOB: 1966/03/12   Date:11/16/2019       Progress Note  Subjective  Chief Complaint  Chief Complaint  Patient presents with  . Medication Refill  . Chest Pain    intermittent  . Wrist Pain    both wrist intermittent  . Rash    she developed a rash when she was clearing cacti from yard. She has red spots on both arms.    HPI  Chest pain: she states over the past few months she has noticed intermittent left side of chest , starts around the sternum and radiates to the left side. It can happen any time of the day, during rest or activity , sometimes when stressed.Usually lasts 5-10 minutes She denies diaphoresis, nausea, vomiting. Sometimes associated with SOB. She has occasional dizziness also but not sure if related to chest pain    She tries to relax when it happens. Episodes has been more frequent lately, about 2-3 times week. Used to be very random in the past.   Anxiety: she states overwhelmed , she is the primary caregiver for her mother that is living with her, also helps care for her grandchildren and feels like she cannot relax. She has a history of Major Depression, but she states currently not feeling down or depressed, just anxious. She is taking lexapro. She does not feel comfortable taking her mother back to the nursing home but she is feeling overwhelmed taking care of her  B12 deficiency: she is taking SL B12 otc   Dyslipidemia: she has been pravastatin daily and we will recheck labs   The 10-year ASCVD risk score Mikey Bussing DC Brooke Bonito., et al., 2013) is: 1.5%   Values used to calculate the score:     Age: 54 years     Sex: Female     Is Non-Hispanic African American: No     Diabetic: No     Tobacco smoker: No     Systolic Blood Pressure: [id number] mmHg     Is BP treated: No     HDL Cholesterol: 52 mg/dL     Total Cholesterol: 211 mg/dL   Rash: she was working in her yard, and noticed one bug biting her on her left arm, afterwards she  noticed other bumps on her arms, very itchy, initially swollen but the edema is going down.   Ventral wrist pain: she states sharp sensation going from  mid foram to wrist, no burning, no swelling or redness, seems to be triggered by activity  Post-menopausal bleeding happened again about ne month ago, advised to go back to gyn, but she states she will not go back to be tested. Explained it is to look for cancer. She saw Dr. Leafy Ro March 2021   Patient Active Problem List   Diagnosis Date Noted  . Personal history of colonic polyps   . Polyp of descending colon   . Hand and foot pain 03/15/2019  . History of uterine fibroid 02/15/2019  . Acquired trigger finger of right middle finger 04/27/2018  . Trigger finger, right middle finger 04/19/2018  . Dysfunction of both eustachian tubes 04/19/2018  . Chronic left lumbar radiculopathy 04/19/2018  . ASCUS of cervix with negative high risk HPV 04/10/2017  . Abnormal uterine bleeding (AUB) 02/27/2016  . Uterine fibroid 02/27/2016  . Generalized anxiety disorder 09/20/2015  . Allergic rhinitis 05/31/2015  . Depression, major, recurrent, mild ([place]) 05/31/2015  . Dyslipidemia 05/31/2015  . Blood  glucose elevated 05/31/2015  . Irritable bowel syndrome with diarrhea 05/31/2015  . Female climacteric state 05/31/2015  . Pituitary adenoma ([place]) 05/31/2015  . Restless legs syndrome 05/31/2015  . B12 deficiency 05/31/2015  . Epicondylitis, lateral (tennis elbow) 05/31/2015  . Fracture, toe 04/13/2013  . Tobacco use 05/03/2009  . Vitamin D deficiency 05/03/2009    Past Surgical History:  Procedure Laterality Date  . COLONOSCOPY WITH PROPOFOL N/A 05/31/2019   Procedure: COLONOSCOPY WITH PROPOFOL;  Surgeon: Lucilla Lame, MD;  Location[facility] END[facility]COPY;  Service: Endoscopy;  Laterality: N/A;  . TONSILECTOMY, ADENOIDECTOMY, BILATERAL MYRINGOTOMY AND TUBES    . TUBAL LIGATION    . WISDOM TOOTH EXTRACTION     patient stated that she now has a dry  socket    Family History  Problem Relation Age of Onset  . Hyperlipidemia Mother   . Hypertension Mother   . Thyroid disease Mother   . Kidney disease Mother   . Colon cancer Mother   . Diabetes Father   . Hypertension Father   . CAD Father   . Stroke Father   . ADD / ADHD Son   . Breast cancer Maternal Grandmother     Social History   Tobacco Use  . Smoking status: Former Smoker    Packs/day: 0.50    Years: 30.00    Pack years: 15.00    Types: Cigarettes  . Smokeless tobacco: Former Systems developer    Quit date: 08/06/2016  . Tobacco comment: switched to e-cigarettes  Substance Use Topics  . Alcohol use: Yes    Comment: occasional     Current Outpatient Medications:  .  ALPRAZolam (XANAX) 0.5 MG tablet, Take 1 tablet (0.5 mg total) by mouth daily as needed., Disp: 30 tablet, Rfl: 0 .  Cholecalciferol (VITAMIN D) 50 MCG (2000 UT) CAPS, Take 1 capsule (2,000 Units total) by mouth daily., Disp: 30 capsule, Rfl: 0 .  co-enzyme Q-10 30 MG capsule, Take 30 mg by mouth 3 (three) times daily., Disp: , Rfl:  .  Cyanocobalamin (B-12) 1000 MCG SUBL, Place 1 tablet under the tongue every other day. , Disp: , Rfl:  .  escitalopram (LEXAPRO) 20 MG tablet, Take 1 tablet (20 mg total) by mouth daily., Disp: 90 tablet, Rfl: 1 .  fluticasone (FLONASE) 50 MCG/ACT nasal spray, SPRAY 2 SPRAYS INTO EACH NOSTRIL EVERY DAY, Disp: 48 mL, Rfl: 0 .  loratadine (CLARITIN) 10 MG tablet, TAKE 1 TABLET BY MOUTH EVERY DAY, Disp: 90 tablet, Rfl: 1 .  lovastatin (MEVACOR) 20 MG tablet, Take 1 tablet (20 mg total) by mouth every other day., Disp: 90 tablet, Rfl: 1 .  Ginger, Zingiber officinalis, (GINGER PO), Take 12 mg by mouth daily., Disp: , Rfl:   Allergies  Allergen Reactions  . Biaxin  [Clarithromycin] Other (See Comments)  . Bupropion Hcl Diarrhea  . Darvocet [Propoxyphene N-Acetaminophen] Nausea Only    I personally reviewed active problem list, medication list, allergies, family history, social  history, health maintenance with the patient/caregiver today.   ROS  Constitutional: Negative for fever or weight change.  Respiratory: Negative for cough and shortness of breath.   Cardiovascular: Negative for chest pain or palpitations.  Gastrointestinal: Negative for abdominal pain, no bowel changes.  Musculoskeletal: Negative for gait problem or joint swelling.  Skin: Negative for rash.  Neurological: Negative for dizziness or headache.  No other specific complaints in a complete review of systems (except as listed in HPI above).  Objective  Vitals:   11/16/19  1325  BP: 110/68  Pulse: 83  Resp: 16  Temp: (!) 97.5 F (36.4 C)  TempSrc: Temporal  SpO2: 95%  Weight: 194 lb 4.8 oz (88.1 kg)  Height: 5\' 8"  (1.727 m)    Body mass index is 29.54 kg/m.  Physical Exam  Constitutional: Patient appears well-developed and well-nourished. Obese  No distress.  HEENT: head atraumatic, normocephalic, pupils equal and reactive to light Cardiovascular: Normal rate, regular rhythm and normal heart sounds.  No murmur heard. No BLE edema. Pulmonary/Chest: Effort normal and breath sounds normal. No respiratory distress. Abdominal: Soft.  There is no tenderness. Psychiatric: Patient has a normal mood and affect. behavior is normal. Judgment and thought content normal.   PHQ2/9: Depression screen [facility] 2/9 11/16/2019 08/17/2019 05/24/2019 02/15/2019 10/12/2018  Decreased Interest 0 0 0 1 0  Down, Depressed, Hopeless 0 0 1 1 0  PHQ - 2 Score 0 0 1 2 0  Altered sleeping 0 0 0 1 3  Tired, decreased energy 0 0 0 0 0  Change in appetite 0 0 1 1 0  Feeling bad or failure about yourself  0 0 0 0 0  Trouble concentrating 0 0 0 0 0  Moving slowly or fidgety/restless 0 0 0 1 0  Suicidal thoughts 0 0 0 0 0  PHQ-9 Score 0 0 2 5 3   Difficult doing work/chores - Not difficult at all Somewhat difficult Somewhat difficult Somewhat difficult  Some recent data might be hidden    phq 9 is  negative   Fall Risk: Fall Risk  11/16/2019 05/24/2019 02/15/2019 10/12/2018 05/22/2017  Falls in the past year? 0 0 0 0 No  Number falls in past yr: 0 0 0 0 -  Injury with Fall? 0 0 0 0 -  Comment - - - - -     Functional Status Survey: Is the patient deaf or have difficulty hearing?: No Does the patient have difficulty seeing, even when wearing glasses/contacts?: No Does the patient have difficulty concentrating, remembering, or making decisions?: No Does the patient have difficulty walking or climbing stairs?: No Does the patient have difficulty dressing or bathing?: No Does the patient have difficulty doing errands alone such as visiting a doctor's office or shopping?: No    Assessment & Plan  1. Insect bite of upper arm, unspecified laterality, initial encounter  - triamcinolone cream (KENALOG) 0.1 %; Apply 1 application topically 2 (two) times daily.  Dispense: 30 g; Refill: 0  2. Pain in both wrists  - diclofenac Sodium (VOLTAREN) 1 % GEL; Apply 2 g topically 4 (four) times daily.  Dispense: 100 g; Refill: 0  3. Intermittent chest pain  - Ambulatory referral to Cardiology  4. B12 deficiency  - Vitamin B12  5. Depression, major, recurrent, mild ([place])  Doing well continue lexapro , discussed going away for a weekend , ask sons to help with he mother   56. Generalized anxiety disorder  Alprazolam 30 mg plus on refill to last 6 months, we will also add buspar to take prn since she did not like hydroxyzine  7. Dyslipidemia  - Lipid panel  8. Encounter for long-term (current) use of high-risk medication  - COMPLETE METABOLIC PANEL WITH GFR

## 2019-11-29 ENCOUNTER — Encounter: Payer: Self-pay | Admitting: Family Medicine

## 2019-11-29 ENCOUNTER — Other Ambulatory Visit: Payer: Self-pay | Admitting: Family Medicine

## 2019-11-29 MED ORDER — ATORVASTATIN CALCIUM 10 MG PO TABS: 10.0000 mg | ORAL_TABLET | Freq: Every day | ORAL | 1 refills | Status: DC

## 2019-12-19 ENCOUNTER — Encounter: Payer: Self-pay | Admitting: Family Medicine

## 2019-12-20 DIAGNOSIS — Z79899 Other long term (current) drug therapy: Secondary | ICD-10-CM | POA: Diagnosis not present

## 2019-12-20 DIAGNOSIS — E785 Hyperlipidemia, unspecified: Secondary | ICD-10-CM | POA: Diagnosis not present

## 2019-12-20 DIAGNOSIS — E538 Deficiency of other specified B group vitamins: Secondary | ICD-10-CM | POA: Diagnosis not present

## 2019-12-21 LAB — LIPID PANEL
Cholesterol: 167 mg/dL (ref <200)
HDL: 52 mg/dL (ref >=50)
LDL Cholesterol (Calc): 97 mg/dL (calc)
Non-HDL Cholesterol (Calc): 115 mg/dL (calc) (ref <130)
Total CHOL/HDL Ratio: 3.2 (calc) (ref <5.0)
Triglycerides: 85 mg/dL (ref <150)

## 2019-12-21 LAB — COMPLETE METABOLIC PANEL WITH GFR
AG Ratio: 1.7 (calc) (ref 1.0–2.5)
ALT: 11 U/L (ref 6–29)
AST: 12 U/L (ref 10–35)
Albumin: 4.1 g/dL (ref 3.6–5.1)
Alkaline phosphatase (APISO): 92 U/L (ref 37–153)
BUN: 12 mg/dL (ref 7–25)
CO2: 27 mmol/L (ref 20–32)
Calcium: 9.5 mg/dL (ref 8.6–10.4)
Chloride: 106 mmol/L (ref 98–110)
Creat: 0.98 mg/dL (ref 0.50–1.05)
GFR, Est African American: 75 mL/min/{1.73_m2} (ref >=60)
GFR, Est Non African American: 65 mL/min/{1.73_m2} (ref >=60)
Globulin: 2.4 g/dL (calc) (ref 1.9–3.7)
Glucose, Bld: 94 mg/dL (ref 65–99)
Potassium: 4.5 mmol/L (ref 3.5–5.3)
Sodium: 141 mmol/L (ref 135–146)
Total Bilirubin: 0.3 mg/dL (ref 0.2–1.2)
Total Protein: 6.5 g/dL (ref 6.1–8.1)

## 2019-12-21 LAB — VITAMIN B12: Vitamin B-12: 803 pg/mL (ref 200–1100)

## 2019-12-26 ENCOUNTER — Encounter: Payer: Self-pay | Admitting: Family Medicine

## 2019-12-26 ENCOUNTER — Other Ambulatory Visit: Payer: Self-pay | Admitting: Family Medicine

## 2019-12-26 DIAGNOSIS — F330 Major depressive disorder, recurrent, mild: Secondary | ICD-10-CM

## 2019-12-26 DIAGNOSIS — F411 Generalized anxiety disorder: Secondary | ICD-10-CM

## 2019-12-26 MED ORDER — ESCITALOPRAM OXALATE 10 MG PO TABS: 10.0000 mg | ORAL_TABLET | Freq: Every day | ORAL | 0 refills | Status: DC

## 2020-01-10 ENCOUNTER — Ambulatory Visit: Payer: BC Managed Care – PPO | Admitting: Cardiovascular Disease

## 2020-01-19 ENCOUNTER — Encounter: Payer: Self-pay | Admitting: Family Medicine

## 2020-02-01 ENCOUNTER — Other Ambulatory Visit: Payer: Self-pay | Admitting: Family Medicine

## 2020-02-01 DIAGNOSIS — Z1231 Encounter for screening mammogram for malignant neoplasm of breast: Secondary | ICD-10-CM

## 2020-02-18 DIAGNOSIS — Z20822 Contact with and (suspected) exposure to covid-19: Secondary | ICD-10-CM | POA: Diagnosis not present

## 2020-02-18 DIAGNOSIS — Z03818 Encounter for observation for suspected exposure to other biological agents ruled out: Secondary | ICD-10-CM | POA: Diagnosis not present

## 2020-03-13 ENCOUNTER — Other Ambulatory Visit: Payer: Self-pay | Admitting: Family Medicine

## 2020-03-13 DIAGNOSIS — F411 Generalized anxiety disorder: Secondary | ICD-10-CM

## 2020-03-13 DIAGNOSIS — F330 Major depressive disorder, recurrent, mild: Secondary | ICD-10-CM

## 2020-03-22 ENCOUNTER — Encounter: Payer: Self-pay | Admitting: Radiology

## 2020-03-22 ENCOUNTER — Ambulatory Visit
Admission: RE | Admit: 2020-03-22 | Discharge: 2020-03-22 | Disposition: A | Discharge status: Home / Self Care | Payer: BC Managed Care – PPO | Source: Ambulatory Visit | Attending: Family Medicine | Admitting: Family Medicine

## 2020-03-22 DIAGNOSIS — Z1231 Encounter for screening mammogram for malignant neoplasm of breast: Secondary | ICD-10-CM | POA: Diagnosis not present

## 2020-05-12 ENCOUNTER — Other Ambulatory Visit: Payer: Self-pay | Admitting: Family Medicine

## 2020-05-12 DIAGNOSIS — J302 Other seasonal allergic rhinitis: Secondary | ICD-10-CM

## 2020-05-12 NOTE — Telephone Encounter (Signed)
Requested Prescriptions  Pending Prescriptions Disp Refills   atorvastatin (LIPITOR) 10 MG tablet [Pharmacy Med Name: ATORVASTATIN 10 MG TABLET] 90 tablet 3    Sig: TAKE 1 TABLET BY MOUTH EVERY DAY     Cardiovascular:  Antilipid - Statins Passed - 05/12/2020 11:46 AM      Passed - Total Cholesterol in normal range and within 360 days    Cholesterol, Total  Date Value Ref Range Status  09/24/2015 203 (H) 100 - 199 mg/dL Final   Cholesterol  Date Value Ref Range Status  12/20/2019 167 <200 mg/dL Final         Passed - LDL in normal range and within 360 days    LDL Cholesterol (Calc)  Date Value Ref Range Status  12/20/2019 97 mg/dL (calc) Final    Comment:    Reference range: <100 . Desirable range <100 mg/dL for primary prevention;   <70 mg/dL for patients with CHD or diabetic patients  with > or = 2 CHD risk factors. Marland Kitchen LDL-C is now calculated using the Martin-Hopkins  calculation, which is a validated novel method providing  better accuracy than the Friedewald equation in the  estimation of LDL-C.  Cresenciano Genre et al. Annamaria Helling. 0626;948(54): 2061-2068  (http://education.[facility].com/faq/FAQ164)          Passed - HDL in normal range and within 360 days    HDL  Date Value Ref Range Status  12/20/2019 52 > OR = 50 mg/dL Final  09/24/2015 45 >39 mg/dL Final         Passed - Triglycerides in normal range and within 360 days    Triglycerides  Date Value Ref Range Status  12/20/2019 85 <150 mg/dL Final         Passed - Patient is not pregnant      Passed - Valid encounter within last 12 months    Recent Outpatient Visits          5 months ago Depression, major, recurrent, mild ([place])   [facility] [place], Drue Stager, MD   8 months ago Depression, major, recurrent, mild [facility])   [facility] Steele Sizer, MD   11 months ago Paresthesia of both feet   [facility] Steele Sizer, MD   1 year ago  Prolactinoma [facility])   [facility] Steele Sizer, MD   1 year ago Depression, major, recurrent, mild [facility])   [facility] Steele Sizer, MD      Future Appointments            In 3 days Steele Sizer, MD [facility]            loratadine (CLARITIN) 10 MG tablet [Pharmacy Med Name: LORATADINE 10 MG TABLET] 90 tablet 1    Sig: TAKE 1 TABLET BY MOUTH EVERY DAY     Ear, Nose, and Throat:  Antihistamines Passed - 05/12/2020 11:46 AM      Passed - Valid encounter within last 12 months    Recent Outpatient Visits          5 months ago Depression, major, recurrent, mild ([place])   [facility] [place], Drue Stager, MD   8 months ago Depression, major, recurrent, mild [facility])   [facility] Steele Sizer, MD   11 months ago Paresthesia of both feet   [facility] Steele Sizer, MD   1 year ago Prolactinoma [facility])   [facility]  Steele Sizer, MD   1 year ago Depression, major, recurrent, mild [facility])   [facility] Steele Sizer, MD      Future Appointments            In 3 days Steele Sizer, MD [facility], [facility]

## 2020-05-14 DIAGNOSIS — N950 Postmenopausal bleeding: Secondary | ICD-10-CM | POA: Insufficient documentation

## 2020-05-14 NOTE — Progress Notes (Signed)
Name: Cynthia Bean   MRN: [id number]    DOB: [birth year]   Date:05/15/2020       Progress Note  Subjective  Chief Complaint  Follow up/Medication Refill   HPI   Chest pain: she states symptoms resolved since last visit and did not go see cardiologist.   Anxiety: she states overwhelmed , she is the primary caregiver for her mother that is living with her, also helps care for her grandchildren and feels like she cannot relax, now also worried about husband that was diagnosed with prostate cancer. She has a history of Major Depression, but she states currently not feeling down or depressed, just anxious. She is taking lexapro and thinking about going down, but advised to hold off until Spring to try to wean self off .   B12 deficiency: she is taking SL B12 otc , we will recheck next visit   Pituitary adenoma: last MRI was done 10/12/2015 and size was stable, last prolactin was one year ago and normal. Discussed rechecking MRI but she would like to hold off for now, no headaches.   Dyslipidemia: she has been Atorvastatin  daily and last LDL improved , down to 97   The 10-year ASCVD risk score Mikey Bussing DC Brooke Bonito., et al., 2013) is: 1.3%   Values used to calculate the score:     Age: 54 years     Sex: Female     Is Non-Hispanic African American: No     Diabetic: No     Tobacco smoker: No     Systolic Blood Pressure: 759 mmHg     Is BP treated: No     HDL Cholesterol: 52 mg/dL     Total Cholesterol: 167 mg/dL    Patient Active Problem List   Diagnosis Date Noted  . Post-menopausal bleeding 05/14/2020  . Personal history of colonic polyps   . Polyp of descending colon   . Hand and foot pain 03/15/2019  . History of uterine fibroid 02/15/2019  . Acquired trigger finger of right middle finger 04/27/2018  . Trigger finger, right middle finger 04/19/2018  . Dysfunction of both eustachian tubes 04/19/2018  . Chronic left lumbar radiculopathy 04/19/2018  . ASCUS of cervix with negative high  risk HPV 04/10/2017  . Abnormal uterine bleeding (AUB) 02/27/2016  . Uterine fibroid 02/27/2016  . Generalized anxiety disorder 09/20/2015  . Allergic rhinitis 05/31/2015  . Depression, major, recurrent, mild ([place]) 05/31/2015  . Dyslipidemia 05/31/2015  . Blood glucose elevated 05/31/2015  . Irritable bowel syndrome with diarrhea 05/31/2015  . Female climacteric state 05/31/2015  . Pituitary adenoma ([place]) 05/31/2015  . Restless legs syndrome 05/31/2015  . B12 deficiency 05/31/2015  . Epicondylitis, lateral (tennis elbow) 05/31/2015  . Fracture, toe 04/13/2013  . Tobacco use 05/03/2009  . Vitamin D deficiency 05/03/2009    Past Surgical History:  Procedure Laterality Date  . COLONOSCOPY WITH PROPOFOL N/A 05/31/2019   Procedure: COLONOSCOPY WITH PROPOFOL;  Surgeon: Lucilla Lame, MD;  [facility] ENDOSCOPY;  Service: Endoscopy;  Laterality: N/A;  . TONSILECTOMY, ADENOIDECTOMY, BILATERAL MYRINGOTOMY AND TUBES    . TUBAL LIGATION    . WISDOM TOOTH EXTRACTION     patient stated that she now has a dry socket    Family History  Problem Relation Age of Onset  . Hyperlipidemia Mother   . Hypertension Mother   . Thyroid disease Mother   . Kidney disease Mother   . Colon cancer Mother   . Diabetes Father   .  Hypertension Father   . CAD Father   . Stroke Father   . ADD / ADHD Son   . Breast cancer Maternal Grandmother     Social History   Tobacco Use  . Smoking status: Former Smoker    Packs/day: 0.50    Years: 30.00    Pack years: 15.00    Types: Cigarettes  . Smokeless tobacco: Former Systems developer    Quit date: 08/06/2016  . Tobacco comment: switched to e-cigarettes  Substance Use Topics  . Alcohol use: Yes    Comment: occasional     Current Outpatient Medications:  .  ALPRAZolam (XANAX) 0.5 MG tablet, Take 1 tablet (0.5 mg total) by mouth daily as needed., Disp: 30 tablet, Rfl: 1 .  atorvastatin (LIPITOR) 10 MG tablet, TAKE 1 TABLET BY MOUTH EVERY DAY, Disp: 90 tablet,  Rfl: 3 .  Cholecalciferol (VITAMIN D) 50 MCG (2000 UT) CAPS, Take 1 capsule (2,000 Units total) by mouth daily., Disp: 30 capsule, Rfl: 0 .  co-enzyme Q-10 30 MG capsule, Take 30 mg by mouth 3 (three) times daily., Disp: , Rfl:  .  Cyanocobalamin (B-12) 1000 MCG SUBL, Place 1 tablet under the tongue every other day. , Disp: , Rfl:  .  diclofenac Sodium (VOLTAREN) 1 % GEL, Apply 2 g topically 4 (four) times daily., Disp: 100 g, Rfl: 0 .  escitalopram (LEXAPRO) 10 MG tablet, Take 1 tablet (10 mg total) by mouth daily., Disp: 90 tablet, Rfl: 1 .  fluticasone (FLONASE) 50 MCG/ACT nasal spray, SPRAY 2 SPRAYS INTO EACH NOSTRIL EVERY DAY, Disp: 48 mL, Rfl: 0 .  loratadine (CLARITIN) 10 MG tablet, TAKE 1 TABLET BY MOUTH EVERY DAY, Disp: 90 tablet, Rfl: 1 .  Multiple Vitamin (MULTI-VITAMIN DAILY PO), SMARTSIG:By Mouth, Disp: , Rfl:  .  Specialty Vitamins Products ([employer] MENOPAUSE SUPPORT PO), Take by mouth., Disp: , Rfl:  .  busPIRone (BUSPAR) 5 MG tablet, Take 1 tablet (5 mg total) by mouth 3 (three) times daily as needed., Disp: 30 tablet, Rfl: 0  Allergies  Allergen Reactions  . Biaxin  [Clarithromycin] Other (See Comments)  . Bupropion Hcl Diarrhea  . Darvocet [Propoxyphene N-Acetaminophen] Nausea Only    I personally reviewed active problem list, medication list, allergies, family history, social history, health maintenance, notes from last encounter with the patient/caregiver today.   ROS  Constitutional: Negative for fever or weight change.  Respiratory: Negative for cough and shortness of breath.   Cardiovascular: Negative for chest pain or palpitations.  Gastrointestinal: Negative for abdominal pain, no bowel changes.  Musculoskeletal: Negative for gait problem or joint swelling.  Skin: Negative for rash.  Neurological: Negative for dizziness or headache.  No other specific complaints in a complete review of systems (except as listed in HPI above).  Objective  Vitals:   05/15/20  1450  BP: 112/62  Pulse: 84  Resp: 16  Temp: 98 F (36.7 C)  TempSrc: Oral  SpO2: 98%  Weight: 194 lb 8 oz (88.2 kg)  Height: 5\' 8"  (1.727 m)    Body mass index is 29.57 kg/m.  Physical Exam  Constitutional: Patient appears well-developed and well-nourished.  No distress.  HEENT: head atraumatic, normocephalic, pupils equal and reactive to light, neck supple Cardiovascular: Normal rate, regular rhythm and normal heart sounds.  No murmur heard. No BLE edema. Pulmonary/Chest: Effort normal and breath sounds normal. No respiratory distress. Abdominal: Soft.  There is no tenderness. Psychiatric: Patient has a normal mood and affect. behavior is normal. Judgment  and thought content normal.  PHQ2/9: Depression screen [facility] 2/9 05/15/2020 05/15/2020 11/16/2019 08/17/2019 05/24/2019  Decreased Interest 0 0 0 0 0  Down, Depressed, Hopeless 1 0 0 0 1  PHQ - 2 Score 1 0 0 0 1  Altered sleeping 2 - 0 0 0  Tired, decreased energy 0 - 0 0 0  Change in appetite 0 - 0 0 1  Feeling bad or failure about yourself  0 - 0 0 0  Trouble concentrating 0 - 0 0 0  Moving slowly or fidgety/restless 1 - 0 0 0  Suicidal thoughts 0 - 0 0 0  PHQ-9 Score 4 - 0 0 2  Difficult doing work/chores Not difficult at all - - Not difficult at all Somewhat difficult  Some recent data might be hidden    phq 9 is positive   Fall Risk: Fall Risk  05/15/2020 11/16/2019 05/24/2019 02/15/2019 10/12/2018  Falls in the past year? 0 0 0 0 0  Number falls in past yr: 0 0 0 0 0  Injury with Fall? 0 0 0 0 0  Comment - - - - -     Functional Status Survey: Is the patient deaf or have difficulty hearing?: No Does the patient have difficulty seeing, even when wearing glasses/contacts?: No Does the patient have difficulty concentrating, remembering, or making decisions?: Yes Does the patient have difficulty walking or climbing stairs?: No Does the patient have difficulty dressing or bathing?: No Does the patient have difficulty  doing errands alone such as visiting a doctor's office or shopping?: No   Assessment & Plan  1. Depression, major, recurrent, mild ([facility])  - escitalopram (LEXAPRO) 10 MG tablet; Take 1 tablet (10 mg total) by mouth daily.  Dispense: 90 tablet; Refill: 1  2. Need for immunization against influenza  - Flu Vaccine QUAD 36+ mos IM  3. Generalized anxiety disorder  - escitalopram (LEXAPRO) 10 MG tablet; Take 1 tablet (10 mg total) by mouth daily.  Dispense: 90 tablet; Refill: 1 - ALPRAZolam (XANAX) 0.5 MG tablet; Take 1 tablet (0.5 mg total) by mouth daily as needed.  Dispense: 30 tablet; Refill: 1 - busPIRone (BUSPAR) 5 MG tablet; Take 1 tablet (5 mg total) by mouth 3 (three) times daily as needed.  Dispense: 30 tablet; Refill: 0  4. Dyslipidemia  Continue medication   5. Pituitary adenoma ([place])  Discussed repeating MRI  6. B12 deficiency   7. Vitamin D deficiency   8. Seasonal allergies

## 2020-05-15 ENCOUNTER — Encounter: Payer: Self-pay | Admitting: Family Medicine

## 2020-05-15 ENCOUNTER — Other Ambulatory Visit: Payer: Self-pay

## 2020-05-15 ENCOUNTER — Ambulatory Visit (INDEPENDENT_AMBULATORY_CARE_PROVIDER_SITE_OTHER): Payer: BC Managed Care – PPO | Admitting: Family Medicine

## 2020-05-15 VITALS — BP 112/62 | HR 84 | Temp 98.0°F | Resp 16 | Ht 68.0 in | Wt 194.5 lb

## 2020-05-15 DIAGNOSIS — E785 Hyperlipidemia, unspecified: Secondary | ICD-10-CM

## 2020-05-15 DIAGNOSIS — F411 Generalized anxiety disorder: Secondary | ICD-10-CM | POA: Diagnosis not present

## 2020-05-15 DIAGNOSIS — J302 Other seasonal allergic rhinitis: Secondary | ICD-10-CM

## 2020-05-15 DIAGNOSIS — F330 Major depressive disorder, recurrent, mild: Secondary | ICD-10-CM | POA: Diagnosis not present

## 2020-05-15 DIAGNOSIS — Z1159 Encounter for screening for other viral diseases: Secondary | ICD-10-CM

## 2020-05-15 DIAGNOSIS — Z23 Encounter for immunization: Secondary | ICD-10-CM | POA: Diagnosis not present

## 2020-05-15 DIAGNOSIS — E559 Vitamin D deficiency, unspecified: Secondary | ICD-10-CM

## 2020-05-15 DIAGNOSIS — D352 Benign neoplasm of pituitary gland: Secondary | ICD-10-CM

## 2020-05-15 DIAGNOSIS — E538 Deficiency of other specified B group vitamins: Secondary | ICD-10-CM

## 2020-05-15 MED ORDER — ESCITALOPRAM OXALATE 10 MG PO TABS: 10.0000 mg | ORAL_TABLET | Freq: Every day | ORAL | 1 refills | Status: DC

## 2020-05-15 MED ORDER — ALPRAZOLAM 0.5 MG PO TABS: 0.5000 mg | ORAL_TABLET | Freq: Every day | ORAL | 1 refills | Status: DC | PRN

## 2020-05-15 MED ORDER — BUSPIRONE HCL 5 MG PO TABS: 5.0000 mg | ORAL_TABLET | Freq: Three times a day (TID) | ORAL | 0 refills | Status: DC | PRN

## 2020-05-17 ENCOUNTER — Encounter: Payer: Self-pay | Admitting: Family Medicine

## 2020-06-04 ENCOUNTER — Telehealth: Payer: BC Managed Care – PPO | Admitting: Family

## 2020-06-04 DIAGNOSIS — J329 Chronic sinusitis, unspecified: Secondary | ICD-10-CM

## 2020-06-04 DIAGNOSIS — B9689 Other specified bacterial agents as the cause of diseases classified elsewhere: Secondary | ICD-10-CM | POA: Diagnosis not present

## 2020-06-04 MED ORDER — AMOXICILLIN-POT CLAVULANATE 875-125 MG PO TABS: 1.0000 | ORAL_TABLET | Freq: Two times a day (BID) | ORAL | 0 refills | Status: DC

## 2020-06-04 NOTE — Progress Notes (Signed)

## 2020-07-20 ENCOUNTER — Other Ambulatory Visit: Payer: Self-pay | Admitting: Family Medicine

## 2020-07-20 DIAGNOSIS — E785 Hyperlipidemia, unspecified: Secondary | ICD-10-CM

## 2020-08-23 ENCOUNTER — Encounter: Payer: Self-pay | Admitting: Family Medicine

## 2020-08-23 ENCOUNTER — Other Ambulatory Visit: Payer: Self-pay

## 2020-08-28 NOTE — Progress Notes (Unsigned)
Name: Cynthia Bean   MRN: [id number]    DOB: 1966/01/27   Date:08/29/2020       Progress Note  Subjective  Chief Complaint  Acute visit for evaluation of breast mass, but will lose insurance and we will do a regular follow up also   HPI  Breast pain: it started about two weeks ago on left anterior axillary line, she states not as intense now but still sore to touch and sometimes sharp or burning pain. No rashes over the area. No fever or chills. She states her grandmother had breast cancer , she was in her 26's. Last mammogram 03/2020 and normal. No nipple discharge  Anxiety: she states overwhelmed , she is the primary caregiver for her mother that is living with her recently also worried because husband is about to retire and they will not have insurance, she is trying to decide what to do now. Depression is remission  B12 deficiency: she is taking SL B12 otc , last lab was normal   Pituitary adenoma: last MRI was done 10/12/2015 and size was stable, last prolactin was within normal limits. She may be without insurance but does not want to check MRI at this time, no double visions or headaches, no nipple discharge   Dyslipidemia: she has been Atorvastatin  daily and last LDL improved , down to 97 , continue medications and recheck labs before she runs out of insurance    The 10-year ASCVD risk score Mikey Bussing DC Brooke Bonito., et al., 2013) is: 1.3%   Values used to calculate the score:     Age: 55 years     Sex: Female     Is Non-Hispanic African American: No     Diabetic: No     Tobacco smoker: No     Systolic Blood Pressure: 967 mmHg     Is BP treated: No     HDL Cholesterol: 52 mg/dL     Total Cholesterol: 167 mg/dL    Patient Active Problem List   Diagnosis Date Noted  . Post-menopausal bleeding 05/14/2020  . Personal history of colonic polyps   . Polyp of descending colon   . Hand and foot pain 03/15/2019  . History of uterine fibroid 02/15/2019  . Acquired trigger finger of  right middle finger 04/27/2018  . Trigger finger, right middle finger 04/19/2018  . Dysfunction of both eustachian tubes 04/19/2018  . Chronic left lumbar radiculopathy 04/19/2018  . ASCUS of cervix with negative high risk HPV 04/10/2017  . Abnormal uterine bleeding (AUB) 02/27/2016  . Uterine fibroid 02/27/2016  . Generalized anxiety disorder 09/20/2015  . Allergic rhinitis 05/31/2015  . Depression, major, recurrent, mild ([place]) 05/31/2015  . Dyslipidemia 05/31/2015  . Blood glucose elevated 05/31/2015  . Irritable bowel syndrome with diarrhea 05/31/2015  . Female climacteric state 05/31/2015  . Pituitary adenoma ([place]) 05/31/2015  . Restless legs syndrome 05/31/2015  . B12 deficiency 05/31/2015  . Epicondylitis, lateral (tennis elbow) 05/31/2015  . Fracture, toe 04/13/2013  . Tobacco use 05/03/2009  . Vitamin D deficiency 05/03/2009    Past Surgical History:  Procedure Laterality Date  . COLONOSCOPY WITH PROPOFOL N/A 05/31/2019   Procedure: COLONOSCOPY WITH PROPOFOL;  Surgeon: Lucilla Lame, MD;  Location: [facility] ENDOSCOPY;  Service: Endoscopy;  Laterality: N/A;  . TONSILECTOMY, ADENOIDECTOMY, BILATERAL MYRINGOTOMY AND TUBES    . TUBAL LIGATION    . WISDOM TOOTH EXTRACTION     patient stated that she now has a dry socket    Family History  Problem Relation Age of Onset  . Hyperlipidemia Mother   . Hypertension Mother   . Thyroid disease Mother   . Kidney disease Mother   . Colon cancer Mother   . Diabetes Father   . Hypertension Father   . CAD Father   . Stroke Father   . ADD / ADHD Son   . Breast cancer Maternal Grandmother     Social History   Tobacco Use  . Smoking status: Former Smoker    Packs/day: 0.50    Years: 30.00    Pack years: 15.00    Types: Cigarettes  . Smokeless tobacco: Former Systems developer    Quit date: 08/06/2016  . Tobacco comment: switched to e-cigarettes  Substance Use Topics  . Alcohol use: Yes    Comment: occasional     Current Outpatient  Medications:  .  ALPRAZolam (XANAX) 0.5 MG tablet, Take 1 tablet (0.5 mg total) by mouth daily as needed., Disp: 30 tablet, Rfl: 1 .  atorvastatin (LIPITOR) 10 MG tablet, TAKE 1 TABLET BY MOUTH EVERY DAY, Disp: 90 tablet, Rfl: 3 .  Cholecalciferol (VITAMIN D) 50 MCG (2000 UT) CAPS, Take 1 capsule (2,000 Units total) by mouth daily., Disp: 30 capsule, Rfl: 0 .  co-enzyme Q-10 30 MG capsule, Take 30 mg by mouth 3 (three) times daily., Disp: , Rfl:  .  Cyanocobalamin (B-12) 1000 MCG SUBL, Place 1 tablet under the tongue every other day. , Disp: , Rfl:  .  escitalopram (LEXAPRO) 10 MG tablet, Take 1 tablet (10 mg total) by mouth daily., Disp: 90 tablet, Rfl: 1 .  fluticasone (FLONASE) 50 MCG/ACT nasal spray, SPRAY 2 SPRAYS INTO EACH NOSTRIL EVERY DAY, Disp: 48 mL, Rfl: 0 .  loratadine (CLARITIN) 10 MG tablet, TAKE 1 TABLET BY MOUTH EVERY DAY, Disp: 90 tablet, Rfl: 1 .  Multiple Vitamin (MULTI-VITAMIN DAILY PO), SMARTSIG:By Mouth, Disp: , Rfl:  .  Specialty Vitamins Products ([employer] MENOPAUSE SUPPORT PO), Take by mouth., Disp: , Rfl:  .  amoxicillin-clavulanate (AUGMENTIN) 875-125 MG tablet, Take 1 tablet by mouth 2 (two) times daily. (Patient not taking: Reported on 08/29/2020), Disp: 14 tablet, Rfl: 0 .  busPIRone (BUSPAR) 5 MG tablet, Take 1 tablet (5 mg total) by mouth 3 (three) times daily as needed. (Patient not taking: Reported on 08/29/2020), Disp: 30 tablet, Rfl: 0 .  diclofenac Sodium (VOLTAREN) 1 % GEL, Apply 2 g topically 4 (four) times daily. (Patient not taking: Reported on 08/29/2020), Disp: 100 g, Rfl: 0  Allergies  Allergen Reactions  . Biaxin  [Clarithromycin] Other (See Comments)  . Bupropion Hcl Diarrhea  . Darvocet [Propoxyphene N-Acetaminophen] Nausea Only    I personally reviewed active problem list, medication list, allergies, family history, social history, health maintenance with the patient/caregiver today.   ROS  Constitutional: Negative for fever or weight change.   Respiratory: Negative for cough and shortness of breath.   Cardiovascular: Negative for chest pain or palpitations.  Gastrointestinal: Negative for abdominal pain, no bowel changes.  Musculoskeletal: Negative for gait problem or joint swelling.  Skin: Negative for rash.  Neurological: Negative for dizziness or headache.  No other specific complaints in a complete review of systems (except as listed in HPI above).  Objective  Vitals:   08/29/20 1021  BP: 110/80  Pulse: 88  Resp: 16  Temp: 98 F (36.7 C)  TempSrc: Oral  SpO2: 98%  Weight: 197 lb 3.2 oz (89.4 kg)  Height: 5\' 8"  (1.727 m)    Body mass  index is 29.98 kg/m.  Physical Exam  Constitutional: Patient appears well-developed and well-nourished.  No distress.  HEENT: head atraumatic, normocephalic, pupils equal and reactive to light, neck supple, normal thyroid  Cardiovascular: Normal rate, regular rhythm and normal heart sounds.  No murmur heard. No BLE edema. Breast: tender bilaterally but worse and lumpy on left upper outer quadrant, no axillary lymphadenopathy  Pulmonary/Chest: Effort normal and breath sounds normal. No respiratory distress. Abdominal: Soft.  There is no tenderness. Psychiatric: Patient has a normal mood and affect. behavior is normal. Judgment and thought content normal.  PHQ2/9: Depression screen [facility] 2/9 08/29/2020 05/15/2020 05/15/2020 11/16/2019 08/17/2019  Decreased Interest 0 0 0 0 0  Down, Depressed, Hopeless 0 1 0 0 0  PHQ - 2 Score 0 1 0 0 0  Altered sleeping - 2 - 0 0  Tired, decreased energy - 0 - 0 0  Change in appetite - 0 - 0 0  Feeling bad or failure about yourself  - 0 - 0 0  Trouble concentrating - 0 - 0 0  Moving slowly or fidgety/restless - 1 - 0 0  Suicidal thoughts - 0 - 0 0  PHQ-9 Score - 4 - 0 0  Difficult doing work/chores - Not difficult at all - - Not difficult at all  Some recent data might be hidden    phq 9 is negative   Fall Risk: Fall Risk  08/29/2020 05/15/2020  11/16/2019 05/24/2019 02/15/2019  Falls in the past year? 0 0 0 0 0  Number falls in past yr: 0 0 0 0 0  Injury with Fall? 0 0 0 0 0  Comment - - - - -     Functional Status Survey: Is the patient deaf or have difficulty hearing?: No Does the patient have difficulty seeing, even when wearing glasses/contacts?: No Does the patient have difficulty concentrating, remembering, or making decisions?: Yes (Remembering) Does the patient have difficulty walking or climbing stairs?: No Does the patient have difficulty dressing or bathing?: No Does the patient have difficulty doing errands alone such as visiting a doctor's office or shopping?: No    Assessment & Plan  1. Breast pain, left  - MM Digital Diagnostic Bilat; Future - US BREAST LTD UNI LEFT INC AXILLA; Future - US BREAST LTD UNI RIGHT INC AXILLA; Future  2. Family history of hypothyroidism  - TSH + free T4  3. Dry skin  - TSH + free T4  4. Need for shingles vaccine  She wants to hold off for now  5. Depression, major, in remission ([place])  - escitalopram (LEXAPRO) 10 MG tablet; Take 1 tablet (10 mg total) by mouth daily.  Dispense: 90 tablet; Refill: 1  6. Pituitary adenoma ([place])  - Prolactin  7. B12 deficiency  - CBC with Differential/Platelet - Vitamin B12  8. Dyslipidemia  - Lipid panel  9. Vitamin D deficiency  - VITAMIN D 25 Hydroxy (Vit-D Deficiency, Fractures)  10. Seasonal allergies  - loratadine (CLARITIN) 10 MG tablet; Take 1 tablet (10 mg total) by mouth daily.  Dispense: 90 tablet; Refill: 1  11. Generalized anxiety disorder  - escitalopram (LEXAPRO) 10 MG tablet; Take 1 tablet (10 mg total) by mouth daily.  Dispense: 90 tablet; Refill: 1  12. Diabetes mellitus screening  - Hemoglobin A1c  13. Long-term use of high-risk medication  - COMPLETE METABOLIC PANEL WITH GFR  14. Mass of upper outer quadrant of left breast

## 2020-08-29 ENCOUNTER — Other Ambulatory Visit: Payer: Self-pay

## 2020-08-29 ENCOUNTER — Ambulatory Visit (INDEPENDENT_AMBULATORY_CARE_PROVIDER_SITE_OTHER): Payer: Commercial Managed Care - PPO | Admitting: Family Medicine

## 2020-08-29 ENCOUNTER — Encounter: Payer: Self-pay | Admitting: Family Medicine

## 2020-08-29 VITALS — BP 110/80 | HR 88 | Temp 98.0°F | Resp 16 | Ht 68.0 in | Wt 197.2 lb

## 2020-08-29 DIAGNOSIS — Z23 Encounter for immunization: Secondary | ICD-10-CM

## 2020-08-29 DIAGNOSIS — L853 Xerosis cutis: Secondary | ICD-10-CM | POA: Diagnosis not present

## 2020-08-29 DIAGNOSIS — Z8349 Family history of other endocrine, nutritional and metabolic diseases: Secondary | ICD-10-CM

## 2020-08-29 DIAGNOSIS — N6321 Unspecified lump in the left breast, upper outer quadrant: Secondary | ICD-10-CM

## 2020-08-29 DIAGNOSIS — N644 Mastodynia: Secondary | ICD-10-CM

## 2020-08-29 DIAGNOSIS — Z131 Encounter for screening for diabetes mellitus: Secondary | ICD-10-CM

## 2020-08-29 DIAGNOSIS — F411 Generalized anxiety disorder: Secondary | ICD-10-CM

## 2020-08-29 DIAGNOSIS — Z79899 Other long term (current) drug therapy: Secondary | ICD-10-CM

## 2020-08-29 DIAGNOSIS — J302 Other seasonal allergic rhinitis: Secondary | ICD-10-CM

## 2020-08-29 DIAGNOSIS — E559 Vitamin D deficiency, unspecified: Secondary | ICD-10-CM

## 2020-08-29 DIAGNOSIS — D352 Benign neoplasm of pituitary gland: Secondary | ICD-10-CM

## 2020-08-29 DIAGNOSIS — E785 Hyperlipidemia, unspecified: Secondary | ICD-10-CM

## 2020-08-29 DIAGNOSIS — F325 Major depressive disorder, single episode, in full remission: Secondary | ICD-10-CM

## 2020-08-29 DIAGNOSIS — E538 Deficiency of other specified B group vitamins: Secondary | ICD-10-CM

## 2020-08-29 DIAGNOSIS — Z1159 Encounter for screening for other viral diseases: Secondary | ICD-10-CM

## 2020-08-29 MED ORDER — ESCITALOPRAM OXALATE 10 MG PO TABS: 10.0000 mg | ORAL_TABLET | Freq: Every day | ORAL | 1 refills | Status: DC

## 2020-08-29 MED ORDER — LORATADINE 10 MG PO TABS: 10.0000 mg | ORAL_TABLET | Freq: Every day | ORAL | 1 refills | Status: DC

## 2020-08-30 ENCOUNTER — Telehealth: Payer: Self-pay

## 2020-08-30 DIAGNOSIS — N644 Mastodynia: Secondary | ICD-10-CM

## 2020-08-30 LAB — COMPLETE METABOLIC PANEL WITH GFR
AG Ratio: 1.8 (calc) (ref 1.0–2.5)
ALT: 14 U/L (ref 6–29)
AST: 14 U/L (ref 10–35)
Albumin: 4.3 g/dL (ref 3.6–5.1)
Alkaline phosphatase (APISO): 114 U/L (ref 37–153)
BUN: 11 mg/dL (ref 7–25)
CO2: 28 mmol/L (ref 20–32)
Calcium: 9.9 mg/dL (ref 8.6–10.4)
Chloride: 104 mmol/L (ref 98–110)
Creat: 0.98 mg/dL (ref 0.50–1.05)
GFR, Est African American: 75 mL/min/{1.73_m2} (ref >=60)
GFR, Est Non African American: 65 mL/min/{1.73_m2} (ref >=60)
Globulin: 2.4 g/dL (calc) (ref 1.9–3.7)
Glucose, Bld: 84 mg/dL (ref 65–99)
Potassium: 4.2 mmol/L (ref 3.5–5.3)
Sodium: 141 mmol/L (ref 135–146)
Total Bilirubin: 0.4 mg/dL (ref 0.2–1.2)
Total Protein: 6.7 g/dL (ref 6.1–8.1)

## 2020-08-30 LAB — CBC WITH DIFFERENTIAL/PLATELET
Absolute Monocytes: 583 cells/uL (ref 200–950)
Basophils Absolute: 62 cells/uL (ref 0–200)
Basophils Relative: 1 %
Eosinophils Absolute: 93 cells/uL (ref 15–500)
Eosinophils Relative: 1.5 %
HCT: 40 % (ref 35.0–45.0)
Hemoglobin: 13.7 g/dL (ref 11.7–15.5)
Lymphs Abs: 1649 cells/uL (ref 850–3900)
MCH: 32.2 pg (ref 27.0–33.0)
MCHC: 34.3 g/dL (ref 32.0–36.0)
MCV: 94.1 fL (ref 80.0–100.0)
MPV: 10 fL (ref 7.5–12.5)
Monocytes Relative: 9.4 %
Neutro Abs: 3813 cells/uL (ref 1500–7800)
Neutrophils Relative %: 61.5 %
Platelets: 312 10*3/uL (ref 140–400)
RBC: 4.25 10*6/uL (ref 3.80–5.10)
RDW: 11.8 % (ref 11.0–15.0)
Total Lymphocyte: 26.6 %
WBC: 6.2 10*3/uL (ref 3.8–10.8)

## 2020-08-30 LAB — LIPID PANEL
Cholesterol: 177 mg/dL (ref <200)
HDL: 58 mg/dL (ref >=50)
LDL Cholesterol (Calc): 100 mg/dL (calc) — ABNORMAL HIGH
Non-HDL Cholesterol (Calc): 119 mg/dL (calc) (ref <130)
Total CHOL/HDL Ratio: 3.1 (calc) (ref <5.0)
Triglycerides: 98 mg/dL (ref <150)

## 2020-08-30 LAB — VITAMIN D 25 HYDROXY (VIT D DEFICIENCY, FRACTURES): Vit D, 25-Hydroxy: 47 ng/mL (ref 30–100)

## 2020-08-30 LAB — TSH+FREE T4: TSH W/REFLEX TO FT4: 1.4 mIU/L

## 2020-08-30 LAB — PROLACTIN: Prolactin: 8.6 ng/mL

## 2020-08-30 LAB — VITAMIN B12: Vitamin B-12: 484 pg/mL (ref 200–1100)

## 2020-08-30 LAB — HEMOGLOBIN A1C
Hgb A1c MFr Bld: 5.4 % of total Hgb (ref <5.7)
Mean Plasma Glucose: 108 mg/dL
eAG (mmol/L): 6 mmol/L

## 2020-08-30 NOTE — Telephone Encounter (Signed)
New order placed

## 2020-09-04 ENCOUNTER — Ambulatory Visit: Payer: BC Managed Care – PPO | Admitting: Family Medicine

## 2020-09-07 ENCOUNTER — Other Ambulatory Visit: Payer: Self-pay

## 2020-09-07 ENCOUNTER — Ambulatory Visit
Admission: RE | Admit: 2020-09-07 | Discharge: 2020-09-07 | Disposition: A | Discharge status: Home / Self Care | Payer: Commercial Managed Care - PPO | Source: Ambulatory Visit | Attending: Family Medicine | Admitting: Family Medicine

## 2020-09-07 DIAGNOSIS — N644 Mastodynia: Secondary | ICD-10-CM | POA: Diagnosis not present

## 2020-10-15 ENCOUNTER — Ambulatory Visit: Payer: BC Managed Care – PPO | Admitting: Family Medicine

## 2020-11-18 ENCOUNTER — Other Ambulatory Visit: Payer: Self-pay | Admitting: Family Medicine

## 2020-11-18 DIAGNOSIS — E785 Hyperlipidemia, unspecified: Secondary | ICD-10-CM

## 2020-11-30 NOTE — Progress Notes (Deleted)
Name: Cynthia Bean   MRN: [id number]    DOB: 06-Dec-1965   Date:11/30/2020       Progress Note  Subjective  Chief Complaint  Medication Refill  HPI  Breast pain: it started about two weeks ago on left anterior axillary line, she states not as intense now but still sore to touch and sometimes sharp or burning pain. No rashes over the area. No fever or chills. She states her grandmother had breast cancer , she was in her 1's. Last mammogram 03/2020 and normal. No nipple discharge  Anxiety: she states overwhelmed , she is the primary caregiver for her mother that is living with her recently also worried because husband is about to retire and they will not have insurance, she is trying to decide what to do now. Depression is remission  B12 deficiency: she is taking SL B12 otc , last lab was normal   Pituitary adenoma: last MRI was done 10/12/2015 and size was stable, last prolactin was within normal limits. She may be without insurance but does not want to check MRI at this time, no double visions or headaches, no nipple discharge   Dyslipidemia: she has been Atorvastatin  daily and last LDL improved , down to 97 , continue medications and recheck labs before she runs out of insurance    The 10-year ASCVD risk score Mikey Bussing DC Brooke Bonito., et al., 2013) is: 1.3%   Values used to calculate the score:     Age: 55 years     Sex: Female     Is Non-Hispanic African American: No     Diabetic: No     Tobacco smoker: No     Systolic Blood Pressure: 784 mmHg     Is BP treated: No     HDL Cholesterol: 52 mg/dL     Total Cholesterol: 167 mg/dL  Patient Active Problem List   Diagnosis Date Noted  . Post-menopausal bleeding 05/14/2020  . Personal history of colonic polyps   . Polyp of descending colon   . Hand and foot pain 03/15/2019  . History of uterine fibroid 02/15/2019  . Acquired trigger finger of right middle finger 04/27/2018  . Trigger finger, right middle finger 04/19/2018  .  Dysfunction of both eustachian tubes 04/19/2018  . Chronic left lumbar radiculopathy 04/19/2018  . ASCUS of cervix with negative high risk HPV 04/10/2017  . Abnormal uterine bleeding (AUB) 02/27/2016  . Uterine fibroid 02/27/2016  . Generalized anxiety disorder 09/20/2015  . Allergic rhinitis 05/31/2015  . Depression, major, recurrent, mild ([place]) 05/31/2015  . Dyslipidemia 05/31/2015  . Blood glucose elevated 05/31/2015  . Irritable bowel syndrome with diarrhea 05/31/2015  . Female climacteric state 05/31/2015  . Pituitary adenoma ([place]) 05/31/2015  . Restless legs syndrome 05/31/2015  . B12 deficiency 05/31/2015  . Epicondylitis, lateral (tennis elbow) 05/31/2015  . Fracture, toe 04/13/2013  . Tobacco use 05/03/2009  . Vitamin D deficiency 05/03/2009    Past Surgical History:  Procedure Laterality Date  . COLONOSCOPY WITH PROPOFOL N/A 05/31/2019   Procedure: COLONOSCOPY WITH PROPOFOL;  Surgeon: Lucilla Lame, MD;  Location: [facility] ENDOSCOPY;  Service: Endoscopy;  Laterality: N/A;  . TONSILECTOMY, ADENOIDECTOMY, BILATERAL MYRINGOTOMY AND TUBES    . TUBAL LIGATION    . WISDOM TOOTH EXTRACTION     patient stated that she now has a dry socket    Family History  Problem Relation Age of Onset  . Hyperlipidemia Mother   . Hypertension Mother   . Thyroid disease Mother   .  Kidney disease Mother   . Colon cancer Mother   . Diabetes Father   . Hypertension Father   . CAD Father   . Stroke Father   . ADD / ADHD Son   . Breast cancer Maternal Grandmother     Social History   Tobacco Use  . Smoking status: Former Smoker    Packs/day: 0.50    Years: 30.00    Pack years: 15.00    Types: Cigarettes  . Smokeless tobacco: Former Systems developer    Quit date: 08/06/2016  . Tobacco comment: switched to e-cigarettes  Substance Use Topics  . Alcohol use: Yes    Comment: occasional     Current Outpatient Medications:  .  ALPRAZolam (XANAX) 0.5 MG tablet, Take 1 tablet (0.5 mg total) by  mouth daily as needed., Disp: 30 tablet, Rfl: 1 .  atorvastatin (LIPITOR) 10 MG tablet, TAKE 1 TABLET BY MOUTH EVERY DAY, Disp: 90 tablet, Rfl: 3 .  Cholecalciferol (VITAMIN D) 50 MCG (2000 UT) CAPS, Take 1 capsule (2,000 Units total) by mouth daily., Disp: 30 capsule, Rfl: 0 .  co-enzyme Q-10 30 MG capsule, Take 30 mg by mouth 3 (three) times daily., Disp: , Rfl:  .  Cyanocobalamin (B-12) 1000 MCG SUBL, Place 1 tablet under the tongue every other day. , Disp: , Rfl:  .  escitalopram (LEXAPRO) 10 MG tablet, Take 1 tablet (10 mg total) by mouth daily., Disp: 90 tablet, Rfl: 1 .  fluticasone (FLONASE) 50 MCG/ACT nasal spray, SPRAY 2 SPRAYS INTO EACH NOSTRIL EVERY DAY, Disp: 48 mL, Rfl: 0 .  loratadine (CLARITIN) 10 MG tablet, Take 1 tablet (10 mg total) by mouth daily., Disp: 90 tablet, Rfl: 1 .  Multiple Vitamin (MULTI-VITAMIN DAILY PO), SMARTSIG:By Mouth, Disp: , Rfl:  .  Specialty Vitamins Products ([employer] MENOPAUSE SUPPORT PO), Take by mouth., Disp: , Rfl:   Allergies  Allergen Reactions  . Biaxin  [Clarithromycin] Other (See Comments)  . Bupropion Hcl Diarrhea  . Darvocet [Propoxyphene N-Acetaminophen] Nausea Only    I personally reviewed {Reviewed:14835} with the patient/caregiver today.   ROS  ***  Objective  There were no vitals filed for this visit.  There is no height or weight on file to calculate BMI.  Physical Exam ***  No results found for this or any previous visit (from the past 2160 hour(s)).  Diabetic Foot Exam: Diabetic Foot Exam - Simple   No data filed    ***  PHQ2/9: Depression screen [facility] 2/9 08/29/2020 05/15/2020 05/15/2020 11/16/2019 08/17/2019  Decreased Interest 0 0 0 0 0  Down, Depressed, Hopeless 0 1 0 0 0  PHQ - 2 Score 0 1 0 0 0  Altered sleeping - 2 - 0 0  Tired, decreased energy - 0 - 0 0  Change in appetite - 0 - 0 0  Feeling bad or failure about yourself  - 0 - 0 0  Trouble concentrating - 0 - 0 0  Moving slowly or fidgety/restless - 1 - 0 0   Suicidal thoughts - 0 - 0 0  PHQ-9 Score - 4 - 0 0  Difficult doing work/chores - Not difficult at all - - Not difficult at all  Some recent data might be hidden    phq 9 is {gen pos [id number]} ***  Fall Risk: Fall Risk  08/29/2020 05/15/2020 11/16/2019 05/24/2019 02/15/2019  Falls in the past year? 0 0 0 0 0  Number falls in past yr: 0 0 0 0 0  Injury with Fall? 0 0 0 0 0  Comment - - - - -   ***   Functional Status Survey:   ***   Assessment & Plan  *** There are no diagnoses linked to this encounter.

## 2020-12-03 ENCOUNTER — Encounter: Payer: Self-pay | Admitting: Family Medicine

## 2020-12-03 ENCOUNTER — Telehealth (INDEPENDENT_AMBULATORY_CARE_PROVIDER_SITE_OTHER): Payer: Commercial Managed Care - PPO | Admitting: Family Medicine

## 2020-12-03 ENCOUNTER — Ambulatory Visit: Payer: Commercial Managed Care - PPO | Admitting: Family Medicine

## 2020-12-03 VITALS — Ht 68.0 in | Wt 197.0 lb

## 2020-12-03 DIAGNOSIS — E538 Deficiency of other specified B group vitamins: Secondary | ICD-10-CM

## 2020-12-03 DIAGNOSIS — F411 Generalized anxiety disorder: Secondary | ICD-10-CM

## 2020-12-03 DIAGNOSIS — E785 Hyperlipidemia, unspecified: Secondary | ICD-10-CM

## 2020-12-03 DIAGNOSIS — J302 Other seasonal allergic rhinitis: Secondary | ICD-10-CM

## 2020-12-03 DIAGNOSIS — D352 Benign neoplasm of pituitary gland: Secondary | ICD-10-CM | POA: Diagnosis not present

## 2020-12-03 DIAGNOSIS — E559 Vitamin D deficiency, unspecified: Secondary | ICD-10-CM | POA: Diagnosis not present

## 2020-12-03 DIAGNOSIS — R61 Generalized hyperhidrosis: Secondary | ICD-10-CM

## 2020-12-03 DIAGNOSIS — F331 Major depressive disorder, recurrent, moderate: Secondary | ICD-10-CM

## 2020-12-03 DIAGNOSIS — Z122 Encounter for screening for malignant neoplasm of respiratory organs: Secondary | ICD-10-CM

## 2020-12-03 MED ORDER — ESCITALOPRAM OXALATE 20 MG PO TABS: 20.0000 mg | ORAL_TABLET | Freq: Every day | ORAL | 0 refills | Status: DC

## 2020-12-03 MED ORDER — ALPRAZOLAM 0.5 MG PO TABS: 0.5000 mg | ORAL_TABLET | Freq: Every day | ORAL | 2 refills | Status: DC | PRN

## 2020-12-03 MED ORDER — CLONIDINE HCL 0.1 MG PO TABS: 0.1000 mg | ORAL_TABLET | Freq: Every evening | ORAL | 0 refills | Status: DC
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
## 2020-12-13 ENCOUNTER — Encounter: Payer: Self-pay | Admitting: Family Medicine

## 2020-12-24 ENCOUNTER — Ambulatory Visit (INDEPENDENT_AMBULATORY_CARE_PROVIDER_SITE_OTHER): Payer: Commercial Managed Care - PPO | Admitting: Emergency Medicine

## 2020-12-24 ENCOUNTER — Other Ambulatory Visit: Payer: Self-pay

## 2020-12-24 DIAGNOSIS — Z23 Encounter for immunization: Secondary | ICD-10-CM | POA: Diagnosis not present

## 2020-12-31 ENCOUNTER — Telehealth: Payer: Self-pay | Admitting: *Deleted

## 2020-12-31 ENCOUNTER — Encounter: Payer: Self-pay | Admitting: *Deleted

## 2020-12-31 DIAGNOSIS — Z122 Encounter for screening for malignant neoplasm of respiratory organs: Secondary | ICD-10-CM

## 2020-12-31 DIAGNOSIS — Z87891 Personal history of nicotine dependence: Secondary | ICD-10-CM

## 2020-12-31 NOTE — Telephone Encounter (Signed)
Received referral for low dose lung cancer screening CT scan. Message left at phone number listed in EMR for patient to call me back to facilitate scheduling scan.  

## 2020-12-31 NOTE — Telephone Encounter (Signed)
Received referral for initial lung cancer screening scan. Contacted patient and obtained smoking history,(former smoker, quit in 2018) as well as answering questions related to screening process. Patient denies signs of lung cancer such as weight loss or hemoptysis. Patient denies comorbidity that would prevent curative treatment if lung cancer were found. Patient is scheduled for shared decision making visit and CT scan on 01/16/21 @ 10:45am.

## 2021-01-15 ENCOUNTER — Inpatient Hospital Stay: Payer: Commercial Managed Care - PPO | Attending: Oncology | Admitting: Hospice and Palliative Medicine

## 2021-01-15 DIAGNOSIS — Z87891 Personal history of nicotine dependence: Secondary | ICD-10-CM

## 2021-01-15 DIAGNOSIS — Z122 Encounter for screening for malignant neoplasm of respiratory organs: Secondary | ICD-10-CM

## 2021-01-15 NOTE — Progress Notes (Signed)
Virtual Visit via Telephone Note  I connected withNAME@ on 01/15/21 telephone and verified that I am speaking with the correct person using two identifiers.   I discussed the limitations of evaluation and management by telemedicine and the availability of in person appointments. The patient expressed understanding and agreed to proceed.  Location: Patient: Home Provider: Clinic   In accordance with CMS guidelines, patient has met eligibility criteria including age, absence of signs or symptoms of lung cancer.  Social History   Tobacco Use   Smoking status: Former    Packs/day: 0.75    Years: 35.00    Pack years: 26.25    Types: Cigarettes   Smokeless tobacco: Former    Quit date: 08/06/2016   Tobacco comments:    switched to e-cigarettes  Vaping Use   Vaping Use: Every day  Substance Use Topics   Alcohol use: Yes    Comment: occasional   Drug use: No      A shared decision-making session was conducted prior to the performance of CT scan. This includes one or more decision aids, includes benefits and harms of screening, follow-up diagnostic testing, over-diagnosis, false positive rate, and total radiation exposure.   Counseling on the importance of adherence to annual lung cancer LDCT screening, impact of co-morbidities, and ability or willingness to undergo diagnosis and treatment is imperative for compliance of the program.   Counseling on the importance of continued smoking cessation for former smokers; the importance of smoking cessation for current smokers, and information about tobacco cessation interventions have been given to patient including [place] and [facility] [place] programs.   Written order for lung cancer screening with LDCT has been given to the patient and any and all questions have been answered to the best of my abilities.    Yearly follow up will be coordinated by Burgess Estelle, Thoracic Navigator.  Time Total: 5 minutes  Visit consisted of  counseling and education dealing with complex health screening. Greater than 50%  of this time was spent counseling and coordinating care related to the above assessment and plan.  Signed by: Altha Harm, PhD, NP-C

## 2021-01-16 ENCOUNTER — Inpatient Hospital Stay: Payer: Commercial Managed Care - PPO | Admitting: Hospice and Palliative Medicine

## 2021-01-16 ENCOUNTER — Other Ambulatory Visit: Payer: Self-pay

## 2021-01-16 ENCOUNTER — Ambulatory Visit
Admission: RE | Admit: 2021-01-16 | Discharge: 2021-01-16 | Disposition: A | Discharge status: Home / Self Care | Payer: Commercial Managed Care - PPO | Source: Ambulatory Visit | Attending: Oncology | Admitting: Oncology

## 2021-01-16 DIAGNOSIS — Z87891 Personal history of nicotine dependence: Secondary | ICD-10-CM | POA: Diagnosis not present

## 2021-01-16 DIAGNOSIS — Z122 Encounter for screening for malignant neoplasm of respiratory organs: Secondary | ICD-10-CM | POA: Diagnosis present

## 2021-01-17 ENCOUNTER — Telehealth: Payer: Self-pay | Admitting: *Deleted

## 2021-01-17 NOTE — Telephone Encounter (Signed)
Notified patient of LDCT lung cancer screening program results with recommendation for 12 month follow up imaging. Also notified of incidental findings noted below and is encouraged to discuss further with PCP who will receive a copy of this note and/or the CT report. Patient verbalizes understanding.    IMPRESSION: 1. Lung-RADS 2, benign appearance or behavior. Continue annual screening with low-dose chest CT without contrast in 12 months. 2. Diffuse bronchial wall thickening with emphysema, as above; imaging findings suggestive of underlying COPD. 3. Emphysema and aortic atherosclerosis.   Aortic Atherosclerosis (ICD10-I70.0) and Emphysema (ICD10-J43.9).

## 2021-02-23 ENCOUNTER — Other Ambulatory Visit: Payer: Self-pay | Admitting: Family Medicine

## 2021-02-23 DIAGNOSIS — F411 Generalized anxiety disorder: Secondary | ICD-10-CM

## 2021-02-23 DIAGNOSIS — F331 Major depressive disorder, recurrent, moderate: Secondary | ICD-10-CM

## 2021-02-23 NOTE — Telephone Encounter (Signed)
Requested Prescriptions  Pending Prescriptions Disp Refills  . escitalopram (LEXAPRO) 20 MG tablet [Pharmacy Med Name: ESCITALOPRAM 20 MG TABLET] 90 tablet 0    Sig: TAKE 1 TABLET BY MOUTH EVERY DAY     Psychiatry:  Antidepressants - SSRI Passed - 02/23/2021  9:37 AM      Passed - Completed PHQ-2 or PHQ-9 in the last 360 days      Passed - Valid encounter within last 6 months    Recent Outpatient Visits          2 months ago Pituitary adenoma [facility])   [facility] Steele Sizer, MD   5 months ago Breast pain, left   [facility] [place], Drue Stager, MD   9 months ago Depression, major, recurrent, mild [facility])   [facility] Steele Sizer, MD   1 year ago Depression, major, recurrent, mild [facility])   [facility] Steele Sizer, MD   1 year ago Depression, major, recurrent, mild [facility])   [facility] Steele Sizer, MD      Future Appointments            In 2 weeks Steele Sizer, MD [facility], [facility]

## 2021-02-28 ENCOUNTER — Other Ambulatory Visit: Payer: Self-pay | Admitting: Family Medicine

## 2021-02-28 DIAGNOSIS — R61 Generalized hyperhidrosis: Secondary | ICD-10-CM

## 2021-02-28 NOTE — Telephone Encounter (Signed)
Requested Prescriptions  Pending Prescriptions Disp Refills  . cloNIDine (CATAPRES) 0.1 MG tablet [Pharmacy Med Name: CLONIDINE HCL 0.1 MG TABLET] 90 tablet 0    Sig: TAKE 1 TABLET BY MOUTH AT BEDTIME.     Cardiovascular:  Alpha-2 Agonists Passed - 02/28/2021  1:28 AM      Passed - Last BP in normal range    BP Readings from Last 1 Encounters:  08/29/20 110/80         Passed - Last Heart Rate in normal range    Pulse Readings from Last 1 Encounters:  08/29/20 88         Passed - Valid encounter within last 6 months    Recent Outpatient Visits          2 months ago Pituitary adenoma [facility])   [facility] Steele Sizer, MD   6 months ago Breast pain, left   [facility] [place], Drue Stager, MD   9 months ago Depression, major, recurrent, mild [facility])   [facility] Steele Sizer, MD   1 year ago Depression, major, recurrent, mild [facility])   [facility] Steele Sizer, MD   1 year ago Depression, major, recurrent, mild [facility])   [facility] Steele Sizer, MD      Future Appointments            In 1 week Steele Sizer, MD [facility], [facility]
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
## 2021-03-11 ENCOUNTER — Ambulatory Visit (INDEPENDENT_AMBULATORY_CARE_PROVIDER_SITE_OTHER): Payer: Commercial Managed Care - PPO | Admitting: Family Medicine

## 2021-03-11 ENCOUNTER — Encounter: Payer: Self-pay | Admitting: Family Medicine

## 2021-03-11 ENCOUNTER — Other Ambulatory Visit: Payer: Self-pay

## 2021-03-11 VITALS — BP 112/76 | HR 74 | Temp 98.1°F | Resp 16 | Ht 68.0 in | Wt 197.0 lb

## 2021-03-11 DIAGNOSIS — I700 Atherosclerosis of aorta: Secondary | ICD-10-CM | POA: Diagnosis not present

## 2021-03-11 DIAGNOSIS — F411 Generalized anxiety disorder: Secondary | ICD-10-CM | POA: Diagnosis not present

## 2021-03-11 DIAGNOSIS — F331 Major depressive disorder, recurrent, moderate: Secondary | ICD-10-CM | POA: Diagnosis not present

## 2021-03-11 DIAGNOSIS — D352 Benign neoplasm of pituitary gland: Secondary | ICD-10-CM

## 2021-03-11 DIAGNOSIS — E538 Deficiency of other specified B group vitamins: Secondary | ICD-10-CM

## 2021-03-11 DIAGNOSIS — J432 Centrilobular emphysema: Secondary | ICD-10-CM | POA: Diagnosis not present

## 2021-03-11 DIAGNOSIS — E559 Vitamin D deficiency, unspecified: Secondary | ICD-10-CM

## 2021-03-11 DIAGNOSIS — E785 Hyperlipidemia, unspecified: Secondary | ICD-10-CM

## 2021-03-11 MED ORDER — TRELEGY ELLIPTA 100-62.5-25 MCG/INH IN AEPB: 1.0000 | INHALATION_SPRAY | Freq: Every day | RESPIRATORY_TRACT | 11 refills | Status: DC

## 2021-03-11 MED ORDER — ATORVASTATIN CALCIUM 40 MG PO TABS: 40.0000 mg | ORAL_TABLET | Freq: Every day | ORAL | 1 refills | Status: DC

## 2021-03-11 MED ORDER — ESCITALOPRAM OXALATE 10 MG PO TABS: 10.0000 mg | ORAL_TABLET | Freq: Every day | ORAL | 0 refills | Status: DC

## 2021-03-22 ENCOUNTER — Other Ambulatory Visit: Payer: Self-pay | Admitting: Family Medicine

## 2021-03-22 DIAGNOSIS — F411 Generalized anxiety disorder: Secondary | ICD-10-CM

## 2021-03-23 NOTE — Telephone Encounter (Signed)
Requested medications are due for refill today.  yes  Requested medications are on the active medications list.  yes  Last refill. 12/03/2020  Future visit scheduled.   yes  Notes to clinic.  Medication not delegated.

## 2021-03-25 NOTE — Telephone Encounter (Signed)
Last seen 8.29.2022 upcoming 12.28.2022

## 2021-04-01 ENCOUNTER — Encounter: Payer: Self-pay | Admitting: Family Medicine

## 2021-04-02 ENCOUNTER — Other Ambulatory Visit: Payer: Self-pay | Admitting: Family Medicine

## 2021-04-02 MED ORDER — UMECLIDINIUM-VILANTEROL 62.5-25 MCG/INH IN AEPB: 1.0000 | INHALATION_SPRAY | Freq: Every day | RESPIRATORY_TRACT | 5 refills | Status: DC

## 2021-04-04 ENCOUNTER — Other Ambulatory Visit: Payer: Self-pay | Admitting: Family Medicine

## 2021-04-04 MED ORDER — NYSTATIN 100000 UNIT/ML MT SUSP: 5.0000 mL | Freq: Four times a day (QID) | OROMUCOSAL | 0 refills | Status: DC

## 2021-04-04 NOTE — Telephone Encounter (Signed)
Spoke to patient, let her know nystatin was sent and voucher for Anoro is up front for her to pick up when she gets a chance. She was pleasant, appreciative and verbalized she would pick up voucher tomorrow.

## 2021-04-12 ENCOUNTER — Ambulatory Visit: Payer: Commercial Managed Care - PPO | Admitting: Family Medicine

## 2021-05-07 ENCOUNTER — Other Ambulatory Visit: Payer: Self-pay | Admitting: Family Medicine

## 2021-05-15 ENCOUNTER — Other Ambulatory Visit: Payer: Self-pay | Admitting: Family Medicine

## 2021-05-15 DIAGNOSIS — J302 Other seasonal allergic rhinitis: Secondary | ICD-10-CM

## 2021-05-15 NOTE — Telephone Encounter (Signed)
Requested Prescriptions  Pending Prescriptions Disp Refills  . loratadine (CLARITIN) 10 MG tablet [Pharmacy Med Name: LORATADINE 10 MG TABLET] 90 tablet 1    Sig: TAKE 1 TABLET BY MOUTH EVERY DAY     Ear, Nose, and Throat:  Antihistamines Passed - 05/15/2021  1:26 AM      Passed - Valid encounter within last 12 months    Recent Outpatient Visits          2 months ago Moderate episode of recurrent major depressive disorder [facility])   [facility] Steele Sizer, MD   5 months ago Pituitary adenoma [facility])   [facility] Steele Sizer, MD   8 months ago Breast pain, left   [facility] Steele Sizer, MD   1 year ago Depression, major, recurrent, mild [facility])   [facility] Steele Sizer, MD   1 year ago Depression, major, recurrent, mild [facility])   [facility] Steele Sizer, MD      Future Appointments            In 1 month Ancil Boozer, Drue Stager, MD [facility], [facility]

## 2021-05-21 ENCOUNTER — Other Ambulatory Visit: Payer: Self-pay | Admitting: Family Medicine

## 2021-05-22 ENCOUNTER — Other Ambulatory Visit: Payer: Self-pay

## 2021-05-30 ENCOUNTER — Other Ambulatory Visit: Payer: Self-pay | Admitting: Family Medicine

## 2021-05-30 DIAGNOSIS — R61 Generalized hyperhidrosis: Secondary | ICD-10-CM

## 2021-05-30 NOTE — Telephone Encounter (Signed)
Requested medications are due for refill today.  unsure  Requested medications are on the active medications list.  yes  Last refill. 02/28/2021  Future visit scheduled.   yes  Notes to clinic.  Per note of 03/11/2021 pt is not taking this medication.
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
## 2021-07-10 ENCOUNTER — Encounter: Payer: Self-pay | Admitting: Family Medicine

## 2021-07-10 ENCOUNTER — Ambulatory Visit (INDEPENDENT_AMBULATORY_CARE_PROVIDER_SITE_OTHER): Payer: Commercial Managed Care - PPO | Admitting: Family Medicine

## 2021-07-10 VITALS — BP 128/74 | HR 87 | Temp 98.1°F | Resp 16 | Ht 68.0 in | Wt 196.0 lb

## 2021-07-10 DIAGNOSIS — Z8349 Family history of other endocrine, nutritional and metabolic diseases: Secondary | ICD-10-CM

## 2021-07-10 DIAGNOSIS — F331 Major depressive disorder, recurrent, moderate: Secondary | ICD-10-CM

## 2021-07-10 DIAGNOSIS — Z131 Encounter for screening for diabetes mellitus: Secondary | ICD-10-CM

## 2021-07-10 DIAGNOSIS — Z1231 Encounter for screening mammogram for malignant neoplasm of breast: Secondary | ICD-10-CM

## 2021-07-10 DIAGNOSIS — J432 Centrilobular emphysema: Secondary | ICD-10-CM

## 2021-07-10 DIAGNOSIS — I700 Atherosclerosis of aorta: Secondary | ICD-10-CM

## 2021-07-10 DIAGNOSIS — E538 Deficiency of other specified B group vitamins: Secondary | ICD-10-CM

## 2021-07-10 DIAGNOSIS — R3915 Urgency of urination: Secondary | ICD-10-CM

## 2021-07-10 DIAGNOSIS — F411 Generalized anxiety disorder: Secondary | ICD-10-CM

## 2021-07-10 DIAGNOSIS — D352 Benign neoplasm of pituitary gland: Secondary | ICD-10-CM | POA: Diagnosis not present

## 2021-07-10 DIAGNOSIS — E559 Vitamin D deficiency, unspecified: Secondary | ICD-10-CM

## 2021-07-10 DIAGNOSIS — Z79899 Other long term (current) drug therapy: Secondary | ICD-10-CM

## 2021-07-10 DIAGNOSIS — M2560 Stiffness of unspecified joint, not elsewhere classified: Secondary | ICD-10-CM

## 2021-07-10 DIAGNOSIS — E785 Hyperlipidemia, unspecified: Secondary | ICD-10-CM

## 2021-07-10 DIAGNOSIS — Z1159 Encounter for screening for other viral diseases: Secondary | ICD-10-CM

## 2021-07-10 MED ORDER — ATORVASTATIN CALCIUM 20 MG PO TABS: 20.0000 mg | ORAL_TABLET | Freq: Every day | ORAL | 0 refills | Status: DC

## 2021-07-10 MED ORDER — ESCITALOPRAM OXALATE 20 MG PO TABS: 20.0000 mg | ORAL_TABLET | Freq: Every day | ORAL | 0 refills | Status: DC

## 2021-07-12 LAB — CBC WITH DIFFERENTIAL/PLATELET
Absolute Monocytes: 758 cells/uL (ref 200–950)
Basophils Absolute: 60 cells/uL (ref 0–200)
Basophils Relative: 0.8 %
Eosinophils Absolute: 158 cells/uL (ref 15–500)
Eosinophils Relative: 2.1 %
HCT: 40 % (ref 35.0–45.0)
Hemoglobin: 13.5 g/dL (ref 11.7–15.5)
Lymphs Abs: 2018 cells/uL (ref 850–3900)
MCH: 32.1 pg (ref 27.0–33.0)
MCHC: 33.8 g/dL (ref 32.0–36.0)
MCV: 95.2 fL (ref 80.0–100.0)
MPV: 9.7 fL (ref 7.5–12.5)
Monocytes Relative: 10.1 %
Neutro Abs: 4508 cells/uL (ref 1500–7800)
Neutrophils Relative %: 60.1 %
Platelets: 315 10*3/uL (ref 140–400)
RBC: 4.2 10*6/uL (ref 3.80–5.10)
RDW: 11.8 % (ref 11.0–15.0)
Total Lymphocyte: 26.9 %
WBC: 7.5 10*3/uL (ref 3.8–10.8)

## 2021-07-12 LAB — COMPLETE METABOLIC PANEL WITH GFR
AG Ratio: 1.9 (calc) (ref 1.0–2.5)
ALT: 15 U/L (ref 6–29)
AST: 15 U/L (ref 10–35)
Albumin: 4.5 g/dL (ref 3.6–5.1)
Alkaline phosphatase (APISO): 106 U/L (ref 37–153)
BUN: 14 mg/dL (ref 7–25)
CO2: 27 mmol/L (ref 20–32)
Calcium: 9.6 mg/dL (ref 8.6–10.4)
Chloride: 106 mmol/L (ref 98–110)
Creat: 0.99 mg/dL (ref 0.50–1.03)
Globulin: 2.4 g/dL (calc) (ref 1.9–3.7)
Glucose, Bld: 61 mg/dL — ABNORMAL LOW (ref 65–99)
Potassium: 4.1 mmol/L (ref 3.5–5.3)
Sodium: 142 mmol/L (ref 135–146)
Total Bilirubin: 0.4 mg/dL (ref 0.2–1.2)
Total Protein: 6.9 g/dL (ref 6.1–8.1)
eGFR: 67 mL/min/{1.73_m2} (ref >=60)

## 2021-07-12 LAB — HEMOGLOBIN A1C
Hgb A1c MFr Bld: 5.4 % of total Hgb (ref <5.7)
Mean Plasma Glucose: 108 mg/dL
eAG (mmol/L): 6 mmol/L

## 2021-07-12 LAB — TSH: TSH: 1.83 mIU/L (ref 0.40–4.50)

## 2021-07-12 LAB — ANA,IFA RA DIAG PNL W/RFLX TIT/PATN
Anti Nuclear Antibody (ANA): NEGATIVE
Cyclic Citrullin Peptide Ab: <16 UNITS
Rheumatoid fact SerPl-aCnc: <14 IU/mL (ref <14)

## 2021-07-12 LAB — CULTURE, URINE COMPREHENSIVE
MICRO NUMBER:: 12807772
RESULT:: NO GROWTH
SPECIMEN QUALITY:: ADEQUATE

## 2021-07-12 LAB — HEPATITIS C ANTIBODY
Hepatitis C Ab: NONREACTIVE
SIGNAL TO CUT-OFF: 0.04 (ref <1.00)

## 2021-07-12 LAB — PROLACTIN: Prolactin: 6.5 ng/mL

## 2021-07-12 LAB — SEDIMENTATION RATE: Sed Rate: 17 mm/h (ref 0–30)

## 2021-07-12 LAB — LIPID PANEL
Cholesterol: 176 mg/dL (ref <200)
HDL: 59 mg/dL (ref >=50)
LDL Cholesterol (Calc): 100 mg/dL (calc) — ABNORMAL HIGH
Non-HDL Cholesterol (Calc): 117 mg/dL (calc) (ref <130)
Total CHOL/HDL Ratio: 3 (calc) (ref <5.0)
Triglycerides: 77 mg/dL (ref <150)

## 2021-07-12 LAB — VITAMIN B12: Vitamin B-12: 494 pg/mL (ref 200–1100)

## 2021-07-12 LAB — C-REACTIVE PROTEIN: CRP: 0.7 mg/L (ref <8.0)

## 2021-07-26 ENCOUNTER — Encounter: Payer: Self-pay | Admitting: Family Medicine

## 2021-07-29 ENCOUNTER — Other Ambulatory Visit: Payer: Self-pay | Admitting: Family Medicine

## 2021-07-29 DIAGNOSIS — F411 Generalized anxiety disorder: Secondary | ICD-10-CM

## 2021-07-29 MED ORDER — ANORO ELLIPTA 62.5-25 MCG/ACT IN AEPB: 1.0000 | INHALATION_SPRAY | Freq: Every day | RESPIRATORY_TRACT | 1 refills | Status: DC

## 2021-07-29 NOTE — Telephone Encounter (Signed)
Requested medications are due for refill today.  yes  Requested medications are on the active medications list.  yes  Last refill. 03/25/2021  Future visit scheduled.   yes  Notes to clinic.  Medication not delegated.    Requested Prescriptions  Pending Prescriptions Disp Refills   ALPRAZolam (XANAX) 0.5 MG tablet [Pharmacy Med Name: ALPRAZOLAM 0.5 MG TABLET] 30 tablet 2    Sig: TAKE 1 TABLET BY MOUTH EVERY DAY AS NEEDED     Not Delegated - Psychiatry:  Anxiolytics/Hypnotics Failed - 07/29/2021  8:12 PM      Failed - This refill cannot be delegated      Failed - Urine Drug Screen completed in last 360 days      Passed - Valid encounter within last 6 months    Recent Outpatient Visits           2 weeks ago Centrilobular emphysema [facility])   [facility] Steele Sizer, MD   4 months ago Moderate episode of recurrent [facility] depressive disorder  [facility]   [facility] Steele Sizer, MD   7 months ago Pituitary adenoma [facility])   [facility] Steele Sizer, MD   11 months ago Breast pain, left   [facility] Steele Sizer, MD   1 year ago Depression, major, recurrent, mild [facility])   [facility] Steele Sizer, MD       Future Appointments             In 3 months Ancil Boozer, Drue Stager, MD [facility], [place]   In 4 months Steele Sizer, MD [facility], [facility]

## 2021-07-30 ENCOUNTER — Other Ambulatory Visit: Payer: Self-pay

## 2021-07-30 DIAGNOSIS — F411 Generalized anxiety disorder: Secondary | ICD-10-CM

## 2021-07-31 ENCOUNTER — Other Ambulatory Visit: Payer: Self-pay | Admitting: Family Medicine

## 2021-07-31 ENCOUNTER — Telehealth: Payer: Commercial Managed Care - PPO | Admitting: Physician Assistant

## 2021-07-31 DIAGNOSIS — B9689 Other specified bacterial agents as the cause of diseases classified elsewhere: Secondary | ICD-10-CM

## 2021-07-31 DIAGNOSIS — J019 Acute sinusitis, unspecified: Secondary | ICD-10-CM | POA: Diagnosis not present

## 2021-07-31 MED ORDER — FLUTICASONE-SALMETEROL 250-50 MCG/ACT IN AEPB: 1.0000 | INHALATION_SPRAY | Freq: Two times a day (BID) | RESPIRATORY_TRACT | 1 refills | Status: DC

## 2021-07-31 MED ORDER — DOXYCYCLINE HYCLATE 100 MG PO TABS: 100.0000 mg | ORAL_TABLET | Freq: Two times a day (BID) | ORAL | 0 refills | Status: DC

## 2021-07-31 NOTE — Progress Notes (Signed)
E-Visit for Sinus Problems  We are sorry that you are not feeling well.  Here is how we plan to help!  Based on what you have shared with me it looks like you have sinusitis.  Sinusitis is inflammation and infection in the sinus cavities of the head.  Based on your presentation I believe you most likely have Acute Bacterial Sinusitis.  This is an infection caused by bacteria and is treated with antibiotics. I have prescribed Doxycycline 100mg  by mouth twice a day for 10 days. Unfortunately Azithromycin (Zpack) does not treat a lot of sinus infections successfully. Augmentin is on a national backorder so very hard to get. The Doxycycline is a 1st-line treatment option for sinusitis, just like Augmentin, so should take care of things. You may use an oral decongestant such as Mucinex D or if you have glaucoma or high blood pressure use plain Mucinex. Saline nasal spray help and can safely be used as often as needed for congestion.  If you develop worsening sinus pain, fever or notice severe headache and vision changes, or if symptoms are not better after completion of antibiotic, please schedule an appointment with a health care provider.    Sinus infections are not as easily transmitted as other respiratory infection, however we still recommend that you avoid close contact with loved ones, especially the very young and elderly.  Remember to wash your hands thoroughly throughout the day as this is the number one way to prevent the spread of infection!  Home Care: Only take medications as instructed by your medical team. Complete the entire course of an antibiotic. Do not take these medications with alcohol. A steam or ultrasonic humidifier can help congestion.  You can place a towel over your head and breathe in the steam from hot water coming from a faucet. Avoid close contacts especially the very young and the elderly. Cover your mouth when you cough or sneeze. Always remember to wash your  hands.  Get Help Right Away If: You develop worsening fever or sinus pain. You develop a severe head ache or visual changes. Your symptoms persist after you have completed your treatment plan.  Make sure you Understand these instructions. Will watch your condition. Will get help right away if you are not doing well or get worse.  Thank you for choosing an e-visit.  Your e-visit answers were reviewed by a board certified advanced clinical practitioner to complete your personal care plan. Depending upon the condition, your plan could have included both over the counter or prescription medications.  Please review your pharmacy choice. Make sure the pharmacy is open so you can pick up prescription now. If there is a problem, you may contact your provider through [employer] and have the prescription routed to another pharmacy.  Your safety is important to [place]. If you have drug allergies check your prescription carefully.   For the next 24 hours you can use MyChart to ask questions about today's visit, request a non-urgent call back, or ask for a work or school excuse. You will get an email in the next two days asking about your experience. I hope that your e-visit has been valuable and will speed your recovery.

## 2021-07-31 NOTE — Progress Notes (Signed)
I have spent 5 minutes in review of e-visit questionnaire, review and updating patient chart, medical decision making and response to patient.   Cynthia Pearman Cody Iris Hairston, PA-C    

## 2021-10-23 LAB — HM PAP SMEAR: HM Pap smear: NEGATIVE

## 2021-10-30 ENCOUNTER — Other Ambulatory Visit: Payer: Self-pay | Admitting: Obstetrics and Gynecology

## 2021-10-30 DIAGNOSIS — Z1231 Encounter for screening mammogram for malignant neoplasm of breast: Secondary | ICD-10-CM

## 2021-11-04 NOTE — Progress Notes (Signed)
Name: Cynthia Bean   MRN: [id number]    DOB: [birth year]   Date:11/05/2021 ? ?     Progress Note ? ?Subjective ? ?Chief Complaint ? ?Follow Up ? ?HPI ? ?GAD/MDD: she told me on her last visit that she  has been anxious for many years, but seems to be getting worse since menopause. Mother moved in with her, she is now under hospice and was able to go to hospice home for one week and it was helpful. She is taking lexapro and seems to be helping with anxiety and depression  ? ?B12 deficiency: she has been taking B12 supplements and last level was normal  ?  ?Pituitary adenoma: last MRI was done 10/12/2015 and size was stable, last prolactin done 08/2020 was within normal limits. She denies  double visions or headaches, no nipple discharge .last prolactin level was normal  ?  ?Dyslipidemia/Atherosclerosis aorta : she is now on Atorvastatin 20 mg daily. We will recheck lipid since she has been following a low carb diet and lost weight.  ? ?Emphysema: on CT lung, she has noticed sob with activity, no longer smoking cigarettes , quit smoking in  2018 still vaping but with very little nicotine now We gave her Trelegy but it caused thrush we tried Anoro but too expensive, she is now on Advair but skips doses of medication because she does not like steroid component  ? ?LUQ pain: started about 6 weeks, she is not sure what triggers but seems to happen when she seats down after being active. It is described as soreness. It feels like a kink, sometimes when rubbing the area she feels a knot. No change in bowel movements, no nausea or vomiting. She started Atkins diet in January . No rashes or trauma  ? ?Patient Active Problem List  ? Diagnosis Date Noted  ? Personal history of colonic polyps   ? Polyp of descending colon   ? History of uterine fibroid 02/15/2019  ? Acquired trigger finger of right middle finger 04/27/2018  ? Trigger finger, right middle finger 04/19/2018  ? Dysfunction of both eustachian tubes 04/19/2018  ?  Chronic left lumbar radiculopathy 04/19/2018  ? ASCUS of cervix with negative high risk HPV 04/10/2017  ? Abnormal uterine bleeding (AUB) 02/27/2016  ? Generalized anxiety disorder 09/20/2015  ? Allergic rhinitis 05/31/2015  ? Depression, major, recurrent, mild ([place]) 05/31/2015  ? Dyslipidemia 05/31/2015  ? Blood glucose elevated 05/31/2015  ? Irritable bowel syndrome with diarrhea 05/31/2015  ? Female climacteric state 05/31/2015  ? Pituitary adenoma ([place]) 05/31/2015  ? Restless legs syndrome 05/31/2015  ? B12 deficiency 05/31/2015  ? Epicondylitis, lateral (tennis elbow) 05/31/2015  ? Tobacco use 05/03/2009  ? Vitamin D deficiency 05/03/2009  ? ? ?Past Surgical History:  ?Procedure Laterality Date  ? COLONOSCOPY WITH PROPOFOL N/A 05/31/2019  ? Procedure: COLONOSCOPY WITH PROPOFOL;  Surgeon: Lucilla Lame, MD;  Location: [facility] ENDOSCOPY;  Service: Endoscopy;  Laterality: N/A;  ? TONSILECTOMY, ADENOIDECTOMY, BILATERAL MYRINGOTOMY AND TUBES    ? TUBAL LIGATION    ? WISDOM TOOTH EXTRACTION    ? patient stated that she now has a dry socket  ? ? ?Family History  ?Problem Relation Age of Onset  ? Hyperlipidemia Mother   ? Hypertension Mother   ? Thyroid disease Mother   ? Kidney disease Mother   ? Colon cancer Mother   ? Diabetes Father   ? Hypertension Father   ? CAD Father   ? Stroke Father   ?  ADD / ADHD Son   ? Breast cancer Maternal Grandmother   ? ? ?Social History  ? ?Tobacco Use  ? Smoking status: Former  ?  Packs/day: 0.75  ?  Years: 35.00  ?  Pack years: 26.25  ?  Types: Cigarettes  ? Smokeless tobacco: Former  ?  Quit date: 08/06/2016  ? Tobacco comments:  ?  switched to e-cigarettes  ?Substance Use Topics  ? Alcohol use: Yes  ?  Comment: occasional  ? ? ? ?Current Outpatient Medications:  ?  ALPRAZolam (XANAX) 0.5 MG tablet, TAKE 1 TABLET BY MOUTH EVERY DAY AS NEEDED, Disp: 30 tablet, Rfl: 2 ?  atorvastatin (LIPITOR) 20 MG tablet, Take 1 tablet (20 mg total) by mouth daily., Disp: 90 tablet, Rfl: 0 ?   Cholecalciferol (VITAMIN D) 50 MCG (2000 UT) CAPS, Take 1 capsule (2,000 Units total) by mouth daily., Disp: 30 capsule, Rfl: 0 ?  Cyanocobalamin (B-12) 1000 MCG SUBL, Place 1 tablet under the tongue every other day. , Disp: , Rfl:  ?  escitalopram (LEXAPRO) 20 MG tablet, Take 1 tablet (20 mg total) by mouth daily., Disp: 90 tablet, Rfl: 0 ?  fluticasone (FLONASE) 50 MCG/ACT nasal spray, SPRAY 2 SPRAYS INTO EACH NOSTRIL EVERY DAY, Disp: 48 mL, Rfl: 0 ?  loratadine (CLARITIN) 10 MG tablet, TAKE 1 TABLET BY MOUTH EVERY DAY, Disp: 90 tablet, Rfl: 1 ?  Multiple Vitamin (MULTI-VITAMIN DAILY PO), SMARTSIG:By Mouth, Disp: , Rfl:  ?  Probiotic Product (PROBIOTIC-10 PO), Take by mouth., Disp: , Rfl:  ?  Specialty Vitamins Products ([employer] MENOPAUSE SUPPORT PO), Take by mouth., Disp: , Rfl:  ? ?Allergies  ?Allergen Reactions  ? Biaxin  [Clarithromycin] Other (See Comments)  ? Bupropion Hcl Diarrhea  ? Darvocet [Propoxyphene N-Acetaminophen] Nausea Only  ? ? ?I personally reviewed active problem list, medication list, allergies, family history, social history, health maintenance with the patient/caregiver today. ? ? ?ROS ? ?Constitutional: Negative for fever or weight change.  ?Respiratory: Negative for cough and shortness of breath.   ?Cardiovascular: Negative for chest pain or palpitations.  ?Gastrointestinal: Negative for abdominal pain, no bowel changes.  ?Musculoskeletal: Negative for gait problem or joint swelling.  ?Skin: Negative for rash.  ?Neurological: Negative for dizziness or headache.  ?No other specific complaints in a complete review of systems (except as listed in HPI above).  ? ?Objective ? ?Vitals:  ? 11/05/21 1322  ?BP: 122/68  ?Pulse: 84  ?Resp: 16  ?SpO2: 97%  ?Weight: 168 lb (76.2 kg)  ?Height: '5\' 7"'$  (1.702 m)  ? ? ?Body mass index is 26.31 kg/m?. ? ?Physical Exam ? ?Constitutional: Patient appears well-developed and well-nourished.  No distress.  ?HEENT: head atraumatic, normocephalic, pupils equal and  reactive to light,  neck supple ?Cardiovascular: Normal rate, regular rhythm and normal heart sounds.  No murmur heard. No BLE edema. ?Pulmonary/Chest: Effort normal and breath sounds normal. No respiratory distress. ?Abdominal: Soft.  There is no tenderness. , pain is on left lower ribs , seems muscular pain ?Psychiatric: Patient has a normal mood and affect. behavior is normal. Judgment and thought content normal.  ? ?PHQ2/9: ? ?  11/05/2021  ?  1:19 PM 07/10/2021  ? 10:57 AM 03/11/2021  ?  1:56 PM 12/03/2020  ? 12:32 PM 08/29/2020  ? 10:21 AM  ?Depression screen PHQ 2/9  ?Decreased Interest 0 0 2 3 0  ?Down, Depressed, Hopeless 0 0 2 3 0  ?PHQ - 2 Score 0 0 4 6 0  ?  Altered sleeping 0 0 3 3   ?Tired, decreased energy 0 0 1 3   ?Change in appetite 0 0 0 1   ?Feeling bad or failure about yourself  0 0 0 3   ?Trouble concentrating 0 0 0 3   ?Moving slowly or fidgety/restless 0 0 0 0   ?Suicidal thoughts 0 0 0 0   ?PHQ-9 Score 0 0 8 19   ?Difficult doing work/chores    Extremely dIfficult   ?  ?phq 9 is negative ? ? ?Fall Risk: ? ?  11/05/2021  ?  1:18 PM 07/10/2021  ? 10:57 AM 03/11/2021  ?  1:56 PM 12/03/2020  ? 12:32 PM 08/29/2020  ? 10:21 AM  ?Fall Risk   ?Falls in the past year? 0 0 0 0 0  ?Number falls in past yr: 0 0 0 0 0  ?Injury with Fall? 0 0 0 0 0  ?Risk for fall due to : No Fall Risks No Fall Risks No Fall Risks    ?Follow up Falls prevention discussed Falls prevention discussed Falls prevention discussed Falls prevention discussed   ? ? ? ? ?Functional Status Survey: ?Is the patient deaf or have difficulty hearing?: No ?Does the patient have difficulty seeing, even when wearing glasses/contacts?: No ?Does the patient have difficulty concentrating, remembering, or making decisions?: No ?Does the patient have difficulty walking or climbing stairs?: No ?Does the patient have difficulty dressing or bathing?: No ?Does the patient have difficulty doing errands alone such as visiting a doctor's office or shopping?:  No ? ? ? ?Assessment & Plan ? ?1. Atherosclerosis of aorta ([place]) ? ?- atorvastatin (LIPITOR) 20 MG tablet; Take 1 tablet (20 mg total) by mouth daily.  Dispense: 90 tablet; Refill: 1 ? ?2. Pituitary adenoma ([place]) ? ? ?3.

## 2021-11-05 ENCOUNTER — Ambulatory Visit
Admission: RE | Admit: 2021-11-05 | Discharge: 2021-11-05 | Disposition: A | Discharge status: Home / Self Care | Payer: Commercial Managed Care - PPO | Attending: Family Medicine | Admitting: Family Medicine

## 2021-11-05 ENCOUNTER — Ambulatory Visit (INDEPENDENT_AMBULATORY_CARE_PROVIDER_SITE_OTHER): Payer: Commercial Managed Care - PPO | Admitting: Family Medicine

## 2021-11-05 ENCOUNTER — Encounter: Payer: Self-pay | Admitting: Family Medicine

## 2021-11-05 ENCOUNTER — Ambulatory Visit
Admission: RE | Admit: 2021-11-05 | Discharge: 2021-11-05 | Disposition: A | Discharge status: Home / Self Care | Payer: Commercial Managed Care - PPO | Source: Ambulatory Visit | Attending: Family Medicine | Admitting: Family Medicine

## 2021-11-05 VITALS — BP 122/68 | HR 84 | Resp 16 | Ht 67.0 in | Wt 168.0 lb

## 2021-11-05 DIAGNOSIS — J432 Centrilobular emphysema: Secondary | ICD-10-CM

## 2021-11-05 DIAGNOSIS — F411 Generalized anxiety disorder: Secondary | ICD-10-CM

## 2021-11-05 DIAGNOSIS — R0781 Pleurodynia: Secondary | ICD-10-CM | POA: Diagnosis not present

## 2021-11-05 DIAGNOSIS — I700 Atherosclerosis of aorta: Secondary | ICD-10-CM

## 2021-11-05 DIAGNOSIS — E538 Deficiency of other specified B group vitamins: Secondary | ICD-10-CM

## 2021-11-05 DIAGNOSIS — D352 Benign neoplasm of pituitary gland: Secondary | ICD-10-CM

## 2021-11-05 DIAGNOSIS — E559 Vitamin D deficiency, unspecified: Secondary | ICD-10-CM

## 2021-11-05 DIAGNOSIS — E785 Hyperlipidemia, unspecified: Secondary | ICD-10-CM

## 2021-11-05 DIAGNOSIS — R1012 Left upper quadrant pain: Secondary | ICD-10-CM

## 2021-11-05 DIAGNOSIS — F331 Major depressive disorder, recurrent, moderate: Secondary | ICD-10-CM

## 2021-11-05 DIAGNOSIS — F325 Major depressive disorder, single episode, in full remission: Secondary | ICD-10-CM

## 2021-11-05 MED ORDER — ESCITALOPRAM OXALATE 20 MG PO TABS: 20.0000 mg | ORAL_TABLET | Freq: Every day | ORAL | 1 refills | Status: DC

## 2021-11-05 MED ORDER — ALPRAZOLAM 0.5 MG PO TABS: 0.5000 mg | ORAL_TABLET | Freq: Every day | ORAL | 0 refills | Status: DC | PRN

## 2021-11-05 MED ORDER — ATORVASTATIN CALCIUM 20 MG PO TABS: 20.0000 mg | ORAL_TABLET | Freq: Every day | ORAL | 1 refills | Status: DC

## 2021-11-06 ENCOUNTER — Encounter: Payer: Self-pay | Admitting: Family Medicine

## 2021-11-06 ENCOUNTER — Other Ambulatory Visit: Payer: Self-pay | Admitting: Family Medicine

## 2021-11-06 DIAGNOSIS — E785 Hyperlipidemia, unspecified: Secondary | ICD-10-CM

## 2021-11-06 DIAGNOSIS — E559 Vitamin D deficiency, unspecified: Secondary | ICD-10-CM

## 2021-11-06 DIAGNOSIS — I700 Atherosclerosis of aorta: Secondary | ICD-10-CM

## 2021-11-06 DIAGNOSIS — E538 Deficiency of other specified B group vitamins: Secondary | ICD-10-CM

## 2021-11-07 ENCOUNTER — Encounter: Payer: Self-pay | Admitting: Family Medicine

## 2021-11-07 LAB — COMPLETE METABOLIC PANEL WITH GFR
AG Ratio: 1.8 (calc) (ref 1.0–2.5)
ALT: 10 U/L (ref 6–29)
AST: 11 U/L (ref 10–35)
Albumin: 4.2 g/dL (ref 3.6–5.1)
Alkaline phosphatase (APISO): 68 U/L (ref 37–153)
BUN: 16 mg/dL (ref 7–25)
CO2: 27 mmol/L (ref 20–32)
Calcium: 9.8 mg/dL (ref 8.6–10.4)
Chloride: 106 mmol/L (ref 98–110)
Creat: 0.94 mg/dL (ref 0.50–1.03)
Globulin: 2.3 g/dL (calc) (ref 1.9–3.7)
Glucose, Bld: 89 mg/dL (ref 65–99)
Potassium: 4.1 mmol/L (ref 3.5–5.3)
Sodium: 141 mmol/L (ref 135–146)
Total Bilirubin: 0.5 mg/dL (ref 0.2–1.2)
Total Protein: 6.5 g/dL (ref 6.1–8.1)
eGFR: 71 mL/min/{1.73_m2} (ref >=60)

## 2021-11-07 LAB — LIPID PANEL
Cholesterol: 143 mg/dL (ref <200)
HDL: 49 mg/dL — ABNORMAL LOW (ref >=50)
LDL Cholesterol (Calc): 80 mg/dL (calc)
Non-HDL Cholesterol (Calc): 94 mg/dL (calc) (ref <130)
Total CHOL/HDL Ratio: 2.9 (calc) (ref <5.0)
Triglycerides: 49 mg/dL (ref <150)

## 2021-12-02 ENCOUNTER — Encounter: Payer: Commercial Managed Care - PPO | Admitting: Family Medicine

## 2021-12-04 ENCOUNTER — Ambulatory Visit
Admission: RE | Admit: 2021-12-04 | Discharge: 2021-12-04 | Disposition: A | Discharge status: Home / Self Care | Payer: Commercial Managed Care - PPO | Source: Ambulatory Visit | Attending: Obstetrics and Gynecology | Admitting: Obstetrics and Gynecology

## 2021-12-04 DIAGNOSIS — Z1231 Encounter for screening mammogram for malignant neoplasm of breast: Secondary | ICD-10-CM | POA: Insufficient documentation

## 2022-02-03 ENCOUNTER — Other Ambulatory Visit: Payer: Self-pay | Admitting: Family Medicine

## 2022-02-03 DIAGNOSIS — F411 Generalized anxiety disorder: Secondary | ICD-10-CM

## 2022-03-10 NOTE — Progress Notes (Unsigned)
Name: Cynthia Bean   MRN: [id number]    DOB: 12/30/1965   Date:03/11/2022       Progress Note  Subjective  Chief Complaint  Follow Up  HPI  GAD/MDD: she told me on her last visit that she  has been anxious for many years, but seems to be getting worse since menopause. She is on lexapro and alprazolam, mother moved to a nursing home, husband had back surgery but is doing better, she has been on a low carb diet and has been losing weight.   B12 deficiency/Vitamin D deficiency: we will recheck it  Lower back pain: she noticed left lower back pain intermittent for years, she states it was severe years ago, she states now pain is keeping her awake at night, only radiates down left leg, tingling/ numbness, , left foot feels cold and has cramping, we will try lyrica before bedtime    Pituitary adenoma: last MRI was done 10/12/2015 and size was stable, last prolactin done 08/2020 was within normal limits. She denies  double visions or headaches, no nipple discharge  We will recheck labs - prolactin and TSH she wants to hold off on MRI due to cost    Dyslipidemia/Atherosclerosis aorta : she is now on Atorvastatin 20 mg daily. We will recheck lipid since she has been following a low carb diet and lost 46 lbs since January 2023  Emphysema: on CT lung, she has noticed sob with activity, no longer smoking cigarettes , quit smoking in  2018 still vaping but with very little nicotine now We gave her Trelegy but it caused thrush we tried Anoro but too expensive,, she is about to get [facility] and we will send a rx of Anoro to see if able to afford   LUQ pain: started back in March , she is not sure what triggers but seems to happen when she seats down after being active. It is described as soreness.  No change in bowel movements, no nausea or vomiting. She started Atkins diet in January 23  She states symptoms are not as severe now, sporadatic only Discussed pancreas CT but she would like to hold off for now.  She has lost a lot of weight 46 lbs since January. We will check lipase at least today   AR and URI: she states had a cold for the past 5 days, covid test negative, having rhinorrhea and post-nasal drainage, some right ear pain .   Patient Active Problem List   Diagnosis Date Noted   Personal history of colonic polyps    Polyp of descending colon    History of uterine fibroid 02/15/2019   Acquired trigger finger of right middle finger 04/27/2018   Trigger finger, right middle finger 04/19/2018   Dysfunction of both eustachian tubes 04/19/2018   Chronic left lumbar radiculopathy 04/19/2018   ASCUS of cervix with negative high risk HPV 04/10/2017   Abnormal uterine bleeding (AUB) 02/27/2016   Generalized anxiety disorder 09/20/2015   Allergic rhinitis 05/31/2015   Depression, major, recurrent, mild ([place]) 05/31/2015   Dyslipidemia 05/31/2015   Blood glucose elevated 05/31/2015   Irritable bowel syndrome with diarrhea 05/31/2015   Female climacteric state 05/31/2015   Pituitary adenoma ([place]) 05/31/2015   Restless legs syndrome 05/31/2015   B12 deficiency 05/31/2015   Epicondylitis, lateral (tennis elbow) 05/31/2015   Tobacco use 05/03/2009   Vitamin D deficiency 05/03/2009    Past Surgical History:  Procedure Laterality Date   COLONOSCOPY WITH PROPOFOL N/A 05/31/2019  Procedure: COLONOSCOPY WITH PROPOFOL;  Surgeon: Lucilla Lame, MD;  [facility] END[facility];  Service: Endoscopy;  Laterality: N/A;   TONSILECTOMY, ADENOIDECTOMY, BILATERAL MYRINGOTOMY AND TUBES     TUBAL LIGATION     WISDOM TOOTH EXTRACTION     patient stated that she now has a dry socket    Family History  Problem Relation Age of Onset   Hyperlipidemia Mother    Hypertension Mother    Thyroid disease Mother    Kidney disease Mother    Colon cancer Mother    Diabetes Father    Hypertension Father    CAD Father    Stroke Father    ADD / ADHD Son    Breast cancer Maternal Grandmother     Social History    Tobacco Use   Smoking status: Former    Packs/day: 0.75    Years: 35.00    Total pack years: 26.25    Types: Cigarettes   Smokeless tobacco: Former    Quit date: 08/06/2016   Tobacco comments:    switched to e-cigarettes  Substance Use Topics   Alcohol use: Yes    Comment: occasional     Current Outpatient Medications:    ALPRAZolam (XANAX) 0.5 MG tablet, TAKE 1 TABLET BY MOUTH EVERY DAY AS NEEDED, Disp: 15 tablet, Rfl: 0   atorvastatin (LIPITOR) 20 MG tablet, Take 1 tablet (20 mg total) by mouth daily., Disp: 90 tablet, Rfl: 1   Cholecalciferol (VITAMIN D) 50 MCG (2000 UT) CAPS, Take 1 capsule (2,000 Units total) by mouth daily., Disp: 30 capsule, Rfl: 0   Cyanocobalamin (B-12) 1000 MCG SUBL, Place 1 tablet under the tongue every other day. , Disp: , Rfl:    escitalopram (LEXAPRO) 20 MG tablet, Take 1 tablet (20 mg total) by mouth daily., Disp: 90 tablet, Rfl: 1   fluticasone (FLONASE) 50 MCG/ACT nasal spray, SPRAY 2 SPRAYS INTO EACH NOSTRIL EVERY DAY, Disp: 48 mL, Rfl: 0   loratadine (CLARITIN) 10 MG tablet, TAKE 1 TABLET BY MOUTH EVERY DAY, Disp: 90 tablet, Rfl: 1   Multiple Vitamin (MULTI-VITAMIN DAILY PO), SMARTSIG:By Mouth, Disp: , Rfl:    Probiotic Product (PROBIOTIC-10 PO), Take by mouth., Disp: , Rfl:    Specialty Vitamins Products ([employer] MENOPAUSE SUPPORT PO), Take by mouth., Disp: , Rfl:   Allergies  Allergen Reactions   Biaxin  [Clarithromycin] Other (See Comments)   Bupropion Hcl Diarrhea   Darvocet [Propoxyphene N-Acetaminophen] Nausea Only    I personally reviewed active problem list, medication list, allergies, family history, social history, health maintenance with the patient/caregiver today.   ROS  Constitutional: Negative for fever, positive for  weight change.  Respiratory: positive  for cough and shortness of breath with activity .   Cardiovascular: Negative for chest pain or palpitations.  Gastrointestinal: positive for intermittent LUQ pain  no  bowel changes.  Musculoskeletal: Negative for gait problem or joint swelling.  Skin: Negative for rash.  Neurological: Negative for dizziness or headache.  No other specific complaints in a complete review of systems (except as listed in HPI above).   Objective  Vitals:   03/11/22 1040  BP: 116/74  Pulse: 96  Resp: 16  Temp: 98.4 F (36.9 C)  TempSrc: Oral  SpO2: 98%  Weight: 150 lb 9.6 oz (68.3 kg)  Height: '5\' 7"'$  (1.702 m)    Body mass index is 23.59 kg/m.  Physical Exam  Constitutional: Patient appears well-developed and well-nourished.  No distress.  HEENT: head atraumatic, normocephalic, pupils  equal and reactive to light, neck supple Cardiovascular: Normal rate, regular rhythm and normal heart sounds.  No murmur heard. No BLE edema. Pulmonary/Chest: Effort normal and breath sounds normal. No respiratory distress. Abdominal: Soft.  There is no tenderness. Psychiatric: Patient has a normal mood and affect. behavior is normal. Judgment and thought content normal.    PHQ2/9:    03/11/2022   10:36 AM 11/05/2021    1:19 PM 07/10/2021   10:57 AM 03/11/2021    1:56 PM 12/03/2020   12:32 PM  Depression screen PHQ 2/9  Decreased Interest 0 0 0 2 3  Down, Depressed, Hopeless 0 0 0 2 3  PHQ - 2 Score 0 0 0 4 6  Altered sleeping 0 0 0 3 3  Tired, decreased energy 0 0 0 1 3  Change in appetite 0 0 0 0 1  Feeling bad or failure about yourself  0 0 0 0 3  Trouble concentrating 0 0 0 0 3  Moving slowly or fidgety/restless 0 0 0 0 0  Suicidal thoughts 0 0 0 0 0  PHQ-9 Score 0 0 0 8 19  Difficult doing work/chores Not difficult at all    Extremely dIfficult    phq 9 is negative   Fall Risk:    03/11/2022   10:35 AM 11/05/2021    1:18 PM 07/10/2021   10:57 AM 03/11/2021    1:56 PM 12/03/2020   12:32 PM  Fall Risk   Falls in the past year? 0 0 0 0 0  Number falls in past yr: 0 0 0 0 0  Injury with Fall? 0 0 0 0 0  Risk for fall due to : No Fall Risks No Fall Risks No  Fall Risks No Fall Risks   Follow up Falls prevention discussed;Education provided Falls prevention discussed Falls prevention discussed Falls prevention discussed Falls prevention discussed     Functional Status Survey: Is the patient deaf or have difficulty hearing?: No Does the patient have difficulty seeing, even when wearing glasses/contacts?: No Does the patient have difficulty concentrating, remembering, or making decisions?: No Does the patient have difficulty walking or climbing stairs?: No Does the patient have difficulty dressing or bathing?: No Does the patient have difficulty doing errands alone such as visiting a doctor's office or shopping?: No    Assessment & Plan  1. Atherosclerosis of aorta ([facility])  - Lipid panel  2. Pituitary adenoma ([facility])  - TSH - Prolactin  3. Depression, major, in remission ([place])  Doing well at this time   4. Centrilobular emphysema ([facility])  Not on medication, she states too costly  , she is noticing more SOB we will try Anoro, she does not want anything with steroids   5. B12 deficiency  - Vitamin B12  6. Dyslipidemia   7. Vitamin D deficiency  - VITAMIN D 25 Hydroxy (Vit-D Deficiency, Fractures)  8. Generalized anxiety disorder  - ALPRAZolam (XANAX) 0.5 MG tablet; Take 1 tablet (0.5 mg total) by mouth daily as needed.  Dispense: 45 tablet; Refill: 0  9. URI, acute  Otc medication for now and check for COVID  10. Encounter for screening for lung cancer  - Ambulatory Referral Lung Cancer Screening [facility]  11. Lumbar back pain with radiculopathy affecting left lower extremity  - pregabalin (LYRICA) 50 MG capsule; Take 1-3 capsules (50-150 mg total) by mouth at bedtime.  Dispense: 90 capsule; Refill: 0  12. Long-term use of high-risk medication  - CBC with  Differential/Platelet - COMPLETE METABOLIC PANEL WITH GFR  13. Seasonal allergies  - fluticasone (FLONASE) 50 MCG/ACT nasal spray; Place 2 sprays into  both nostrils daily.  Dispense: 16 mL; Refill: 0

## 2022-03-11 ENCOUNTER — Ambulatory Visit (INDEPENDENT_AMBULATORY_CARE_PROVIDER_SITE_OTHER): Payer: Commercial Managed Care - PPO | Admitting: Family Medicine

## 2022-03-11 ENCOUNTER — Telehealth: Payer: Self-pay | Admitting: Acute Care

## 2022-03-11 ENCOUNTER — Encounter: Payer: Self-pay | Admitting: Family Medicine

## 2022-03-11 VITALS — BP 116/74 | HR 96 | Temp 98.4°F | Resp 16 | Ht 67.0 in | Wt 150.6 lb

## 2022-03-11 DIAGNOSIS — D352 Benign neoplasm of pituitary gland: Secondary | ICD-10-CM | POA: Diagnosis not present

## 2022-03-11 DIAGNOSIS — J302 Other seasonal allergic rhinitis: Secondary | ICD-10-CM

## 2022-03-11 DIAGNOSIS — I700 Atherosclerosis of aorta: Secondary | ICD-10-CM | POA: Diagnosis not present

## 2022-03-11 DIAGNOSIS — F325 Major depressive disorder, single episode, in full remission: Secondary | ICD-10-CM

## 2022-03-11 DIAGNOSIS — Z122 Encounter for screening for malignant neoplasm of respiratory organs: Secondary | ICD-10-CM

## 2022-03-11 DIAGNOSIS — E559 Vitamin D deficiency, unspecified: Secondary | ICD-10-CM

## 2022-03-11 DIAGNOSIS — Z79899 Other long term (current) drug therapy: Secondary | ICD-10-CM

## 2022-03-11 DIAGNOSIS — J432 Centrilobular emphysema: Secondary | ICD-10-CM

## 2022-03-11 DIAGNOSIS — E538 Deficiency of other specified B group vitamins: Secondary | ICD-10-CM

## 2022-03-11 DIAGNOSIS — M5416 Radiculopathy, lumbar region: Secondary | ICD-10-CM

## 2022-03-11 DIAGNOSIS — F411 Generalized anxiety disorder: Secondary | ICD-10-CM

## 2022-03-11 DIAGNOSIS — J069 Acute upper respiratory infection, unspecified: Secondary | ICD-10-CM

## 2022-03-11 DIAGNOSIS — R1012 Left upper quadrant pain: Secondary | ICD-10-CM

## 2022-03-11 DIAGNOSIS — E785 Hyperlipidemia, unspecified: Secondary | ICD-10-CM

## 2022-03-11 MED ORDER — FLUTICASONE PROPIONATE 50 MCG/ACT NA SUSP: 2.0000 | Freq: Every day | NASAL | 0 refills | Status: DC

## 2022-03-11 MED ORDER — UMECLIDINIUM-VILANTEROL 62.5-25 MCG/ACT IN AEPB: 1.0000 | INHALATION_SPRAY | Freq: Every day | RESPIRATORY_TRACT | 1 refills | Status: DC

## 2022-03-11 MED ORDER — ALPRAZOLAM 0.5 MG PO TABS: 0.5000 mg | ORAL_TABLET | Freq: Every day | ORAL | 0 refills | Status: DC | PRN

## 2022-03-11 MED ORDER — PREGABALIN 50 MG PO CAPS: 50.0000 mg | ORAL_CAPSULE | Freq: Every day | ORAL | 0 refills | Status: DC

## 2022-03-11 NOTE — Telephone Encounter (Signed)
Attempted to reach pt to schedule annual LDCT-LVMM

## 2022-03-14 ENCOUNTER — Encounter: Payer: Self-pay | Admitting: Family Medicine

## 2022-03-14 LAB — CBC WITH DIFFERENTIAL/PLATELET
Absolute Monocytes: 452 cells/uL (ref 200–950)
Basophils Absolute: 30 cells/uL (ref 0–200)
Basophils Relative: 0.7 %
Eosinophils Absolute: 90 cells/uL (ref 15–500)
Eosinophils Relative: 2.1 %
HCT: 40 % (ref 35.0–45.0)
Hemoglobin: 13.9 g/dL (ref 11.7–15.5)
Lymphs Abs: 1449 cells/uL (ref 850–3900)
MCH: 32.9 pg (ref 27.0–33.0)
MCHC: 34.8 g/dL (ref 32.0–36.0)
MCV: 94.8 fL (ref 80.0–100.0)
MPV: 10.1 fL (ref 7.5–12.5)
Monocytes Relative: 10.5 %
Neutro Abs: 2279 cells/uL (ref 1500–7800)
Neutrophils Relative %: 53 %
Platelets: 237 10*3/uL (ref 140–400)
RBC: 4.22 10*6/uL (ref 3.80–5.10)
RDW: 11.7 % (ref 11.0–15.0)
Total Lymphocyte: 33.7 %
WBC: 4.3 10*3/uL (ref 3.8–10.8)

## 2022-03-14 LAB — COMPLETE METABOLIC PANEL WITH GFR
AG Ratio: 2 (calc) (ref 1.0–2.5)
ALT: 11 U/L (ref 6–29)
AST: 11 U/L (ref 10–35)
Albumin: 4.4 g/dL (ref 3.6–5.1)
Alkaline phosphatase (APISO): 65 U/L (ref 37–153)
BUN: 16 mg/dL (ref 7–25)
CO2: 30 mmol/L (ref 20–32)
Calcium: 9.9 mg/dL (ref 8.6–10.4)
Chloride: 107 mmol/L (ref 98–110)
Creat: 0.87 mg/dL (ref 0.50–1.03)
Globulin: 2.2 g/dL (calc) (ref 1.9–3.7)
Glucose, Bld: 85 mg/dL (ref 65–99)
Potassium: 4.2 mmol/L (ref 3.5–5.3)
Sodium: 141 mmol/L (ref 135–146)
Total Bilirubin: 0.4 mg/dL (ref 0.2–1.2)
Total Protein: 6.6 g/dL (ref 6.1–8.1)
eGFR: 78 mL/min/{1.73_m2} (ref >=60)

## 2022-03-14 LAB — LIPID PANEL
Cholesterol: 164 mg/dL (ref <200)
HDL: 56 mg/dL (ref >=50)
LDL Cholesterol (Calc): 95 mg/dL (calc)
Non-HDL Cholesterol (Calc): 108 mg/dL (calc) (ref <130)
Total CHOL/HDL Ratio: 2.9 (calc) (ref <5.0)
Triglycerides: 50 mg/dL (ref <150)

## 2022-03-14 LAB — VITAMIN B12: Vitamin B-12: 477 pg/mL (ref 200–1100)

## 2022-03-14 LAB — LIPASE: Lipase: 24 U/L (ref 7–60)

## 2022-03-14 LAB — VITAMIN D 25 HYDROXY (VIT D DEFICIENCY, FRACTURES): Vit D, 25-Hydroxy: 42 ng/mL (ref 30–100)

## 2022-03-14 LAB — PROLACTIN: Prolactin: 5.1 ng/mL

## 2022-03-14 LAB — TSH: TSH: 1.67 mIU/L (ref 0.40–4.50)

## 2022-04-03 ENCOUNTER — Other Ambulatory Visit: Payer: Self-pay | Admitting: Family Medicine

## 2022-04-03 DIAGNOSIS — J302 Other seasonal allergic rhinitis: Secondary | ICD-10-CM

## 2022-04-19 ENCOUNTER — Other Ambulatory Visit: Payer: Self-pay | Admitting: Family Medicine

## 2022-04-19 DIAGNOSIS — J302 Other seasonal allergic rhinitis: Secondary | ICD-10-CM

## 2022-04-22 NOTE — Telephone Encounter (Signed)
Spoke with patient.  She is in the process of changing insurances and would like to wait.  She will let us know when she can schedule.

## 2022-05-02 ENCOUNTER — Other Ambulatory Visit: Payer: Self-pay | Admitting: Family Medicine

## 2022-05-02 DIAGNOSIS — E785 Hyperlipidemia, unspecified: Secondary | ICD-10-CM

## 2022-05-02 DIAGNOSIS — I700 Atherosclerosis of aorta: Secondary | ICD-10-CM

## 2022-06-10 ENCOUNTER — Telehealth: Payer: Commercial Managed Care - PPO | Admitting: Family Medicine

## 2022-06-10 DIAGNOSIS — B9689 Other specified bacterial agents as the cause of diseases classified elsewhere: Secondary | ICD-10-CM

## 2022-06-10 DIAGNOSIS — J019 Acute sinusitis, unspecified: Secondary | ICD-10-CM

## 2022-06-10 MED ORDER — AMOXICILLIN-POT CLAVULANATE 875-125 MG PO TABS: 1.0000 | ORAL_TABLET | Freq: Two times a day (BID) | ORAL | 0 refills | Status: AC

## 2022-06-10 NOTE — Progress Notes (Signed)

## 2022-06-13 NOTE — Progress Notes (Deleted)
Name: Cynthia Bean   MRN: [id number]    DOB: September 28, 1965   Date:06/13/2022       Progress Note  Subjective  Chief Complaint  Follow Up  HPI  GAD/MDD: she told me on her last visit that she  has been anxious for many years, but seems to be getting worse since menopause. She is on lexapro and alprazolam, mother moved to a nursing home, husband had back surgery but is doing better, she has been on a low carb diet and has been losing weight.   B12 deficiency/Vitamin D deficiency: we will recheck it  Lower back pain: she noticed left lower back pain intermittent for years, she states it was severe years ago, she states now pain is keeping her awake at night, only radiates down left leg, tingling/ numbness, , left foot feels cold and has cramping, we will try lyrica before bedtime    Pituitary adenoma: last MRI was done 10/12/2015 and size was stable, last prolactin done 08/2020 was within normal limits. She denies  double visions or headaches, no nipple discharge  We will recheck labs - prolactin and TSH she wants to hold off on MRI due to cost    Dyslipidemia/Atherosclerosis aorta : she is now on Atorvastatin 20 mg daily. We will recheck lipid since she has been following a low carb diet and lost 46 lbs since January 2023  Emphysema: on CT lung, she has noticed sob with activity, no longer smoking cigarettes , quit smoking in  2018 still vaping but with very little nicotine now We gave her Trelegy but it caused thrush we tried Anoro but too expensive,, she is about to get [facility] and we will send a rx of Anoro to see if able to afford   LUQ pain: started back in March , she is not sure what triggers but seems to happen when she seats down after being active. It is described as soreness.  No change in bowel movements, no nausea or vomiting. She started Atkins diet in January 23  She states symptoms are not as severe now, sporadatic only Discussed pancreas CT but she would like to hold off for now.  She has lost a lot of weight 46 lbs since January. We will check lipase at least today   AR and URI: she states had a cold for the past 5 days, covid test negative, having rhinorrhea and post-nasal drainage, some right ear pain .   Patient Active Problem List   Diagnosis Date Noted   Personal history of colonic polyps    Polyp of descending colon    History of uterine fibroid 02/15/2019   Acquired trigger finger of right middle finger 04/27/2018   Trigger finger, right middle finger 04/19/2018   Dysfunction of both eustachian tubes 04/19/2018   Chronic left lumbar radiculopathy 04/19/2018   ASCUS of cervix with negative high risk HPV 04/10/2017   Generalized anxiety disorder 09/20/2015   Allergic rhinitis 05/31/2015   Depression, major, recurrent, mild ([place]) 05/31/2015   Dyslipidemia 05/31/2015   Irritable bowel syndrome with diarrhea 05/31/2015   Female climacteric state 05/31/2015   Pituitary adenoma ([place]) 05/31/2015   Restless legs syndrome 05/31/2015   B12 deficiency 05/31/2015   Epicondylitis, lateral (tennis elbow) 05/31/2015   Tobacco use 05/03/2009   Vitamin D deficiency 05/03/2009    Past Surgical History:  Procedure Laterality Date   COLONOSCOPY WITH PROPOFOL N/A 05/31/2019   Procedure: COLONOSCOPY WITH PROPOFOL;  Surgeon: Lucilla Lame, MD;  Location:  [facility];  Service: Endoscopy;  Laterality: N/A;   TONSILECTOMY, ADENOIDECTOMY, BILATERAL MYRINGOTOMY AND TUBES     TUBAL LIGATION     WISDOM TOOTH EXTRACTION     patient stated that she now has a dry socket    Family History  Problem Relation Age of Onset   Hyperlipidemia Mother    Hypertension Mother    Thyroid disease Mother    Kidney disease Mother    Colon cancer Mother    Diabetes Father    Hypertension Father    CAD Father    Stroke Father    ADD / ADHD Son    Breast cancer Maternal Grandmother     Social History   Tobacco Use   Smoking status: Former    Packs/day: 0.75    Years: 35.00     Total pack years: 26.25    Types: Cigarettes   Smokeless tobacco: Former    Quit date: 08/06/2016   Tobacco comments:    switched to e-cigarettes  Substance Use Topics   Alcohol use: Yes    Comment: occasional     Current Outpatient Medications:    ALPRAZolam (XANAX) 0.5 MG tablet, Take 1 tablet (0.5 mg total) by mouth daily as needed., Disp: 45 tablet, Rfl: 0   amoxicillin-clavulanate (AUGMENTIN) 875-125 MG tablet, Take 1 tablet by mouth 2 (two) times daily for 7 days., Disp: 14 tablet, Rfl: 0   atorvastatin (LIPITOR) 20 MG tablet, TAKE 1 TABLET BY MOUTH EVERY DAY, Disp: 90 tablet, Rfl: 1   Cholecalciferol (VITAMIN D) 50 MCG (2000 UT) CAPS, Take 1 capsule (2,000 Units total) by mouth daily., Disp: 30 capsule, Rfl: 0   Cyanocobalamin (B-12) 1000 MCG SUBL, Place 1 tablet under the tongue every other day. , Disp: , Rfl:    escitalopram (LEXAPRO) 20 MG tablet, Take 1 tablet (20 mg total) by mouth daily., Disp: 90 tablet, Rfl: 1   fluticasone (FLONASE) 50 MCG/ACT nasal spray, SPRAY 2 SPRAYS INTO EACH NOSTRIL EVERY DAY, Disp: 48 mL, Rfl: 1   loratadine (CLARITIN) 10 MG tablet, TAKE 1 TABLET BY MOUTH EVERY DAY, Disp: 90 tablet, Rfl: 1   Multiple Vitamin (MULTI-VITAMIN DAILY PO), SMARTSIG:By Mouth, Disp: , Rfl:    pregabalin (LYRICA) 50 MG capsule, Take 1-3 capsules (50-150 mg total) by mouth at bedtime., Disp: 90 capsule, Rfl: 0   Probiotic Product (PROBIOTIC-10 PO), Take by mouth., Disp: , Rfl:    Specialty Vitamins Products ([employer] MENOPAUSE SUPPORT PO), Take by mouth., Disp: , Rfl:    umeclidinium-vilanterol (ANORO ELLIPTA) 62.5-25 MCG/ACT AEPB, Inhale 1 puff into the lungs daily at 6 (six) AM., Disp: 3 each, Rfl: 1  Allergies  Allergen Reactions   Biaxin  [Clarithromycin] Other (See Comments)   Bupropion Hcl Diarrhea   Darvocet [Propoxyphene N-Acetaminophen] Nausea Only    I personally reviewed active problem list, medication list, allergies, family history, social history, health  maintenance with the patient/caregiver today.   ROS  ***  Objective  There were no vitals filed for this visit.  There is no height or weight on file to calculate BMI.  Physical Exam ***  No results found for this or any previous visit (from the past 2160 hour(s)).   PHQ2/9:    03/11/2022   10:36 AM 11/05/2021    1:19 PM 07/10/2021   10:57 AM 03/11/2021    1:56 PM 12/03/2020   12:32 PM  Depression screen PHQ 2/9  Decreased Interest 0 0 0 2 3  Down, Depressed, Hopeless  0 0 0 2 3  PHQ - 2 Score 0 0 0 4 6  Altered sleeping 0 0 0 3 3  Tired, decreased energy 0 0 0 1 3  Change in appetite 0 0 0 0 1  Feeling bad or failure about yourself  0 0 0 0 3  Trouble concentrating 0 0 0 0 3  Moving slowly or fidgety/restless 0 0 0 0 0  Suicidal thoughts 0 0 0 0 0  PHQ-9 Score 0 0 0 8 19  Difficult doing work/chores Not difficult at all    Extremely dIfficult    phq 9 is {gen pos [id number]}   Fall Risk:    03/11/2022   10:35 AM 11/05/2021    1:18 PM 07/10/2021   10:57 AM 03/11/2021    1:56 PM 12/03/2020   12:32 PM  Fall Risk   Falls in the past year? 0 0 0 0 0  Number falls in past yr: 0 0 0 0 0  Injury with Fall? 0 0 0 0 0  Risk for fall due to : No Fall Risks No Fall Risks No Fall Risks No Fall Risks   Follow up Falls prevention discussed;Education provided Falls prevention discussed Falls prevention discussed Falls prevention discussed Falls prevention discussed      Functional Status Survey:      Assessment & Plan  *** There are no diagnoses linked to this encounter.

## 2022-06-16 ENCOUNTER — Telehealth: Payer: Self-pay

## 2022-06-16 ENCOUNTER — Ambulatory Visit: Payer: Commercial Managed Care - PPO | Admitting: Family Medicine

## 2022-06-16 DIAGNOSIS — Z87891 Personal history of nicotine dependence: Secondary | ICD-10-CM

## 2022-06-16 NOTE — Telephone Encounter (Signed)
Copied from [place] [phone]. Topic: Appointment Scheduling - Scheduling Inquiry for Clinic >> Jun 16, 2022 11:49 AM Erskine Squibb wrote: Reason for CRM: The patient called in requesting to cancel her appt today as her mother who is also a patient has been told she only has 2 weeks to love. The patient just wanted the provider to know. Please assist patient further.

## 2022-06-18 ENCOUNTER — Other Ambulatory Visit: Payer: Self-pay | Admitting: Family Medicine

## 2022-06-18 DIAGNOSIS — F331 Major depressive disorder, recurrent, moderate: Secondary | ICD-10-CM

## 2022-06-18 DIAGNOSIS — F411 Generalized anxiety disorder: Secondary | ICD-10-CM

## 2022-07-25 NOTE — Progress Notes (Unsigned)
Name: Cynthia Bean   MRN: [id number]    DOB: 09/12/1965   Date:07/28/2022       Progress Note  Subjective  Chief Complaint  Medication Refill  HPI  GAD/MDD: she states still worried about her mother that is at hospice home in [place], husband retired, brother came to visit them this past weekend and see their mother, she is worried that she could be hanging on for her. She is taking lexapro, takes alprazolam half dose prn during the day and has ben taking half pill at night to sleep ( help with neuropathy legs and RLS  ) explained lyrica is safer and to only take alprazolam prn   B12 deficiency/Vitamin D deficiency: last level was normal , she would like to have repeat labs   Lower back pain: she noticed left lower back pain intermittent for years, she states it was severe years ago, it was worse when she was driving recently.    Pituitary adenoma: last MRI was done 10/12/2015 and size was stable. She denies  double visions or headaches, no nipple discharge Discussed repeating with her but she wants to hold off for now    Dyslipidemia/Atherosclerosis aorta : she stopped taking Atorvastatin 20 mg daily. She is following a low carb diet. Last LDL was 95 She wants to have labs rechecked before resuming medication   Emphysema: on CT lung done 22 , she has noticed sob with activity, no longer smoking cigarettes , quit smoking in  2018 still vaping but with very little nicotine now We gave her Trelegy but it caused thrush  she stopped using Anoro because it not covered , she states she wants to try Trelegy again and rinse her mouth to prevent thrush . She has noticed some SOB mid mornings - needs to catch her breath, hopefully it will improve with Trelegy She was advised to have yearly CT lung   Weight loss: she developed intermittent LUQ pain around March 2023 a soreness sensation that happens intermittent  when she seats down after being active. No change in bowel movements, no nausea or  vomiting. She started Atkins diet in January 23  She has lost a lot of weight 56 lbs since Dec 2023 . Labs done Summer 2023 were normal but discussed CT abdomen and or referral to GI since it is a significant amount of weight loss. She states pain is not as often now and happy with weight loss. She does not want to have any further studies done   AR : she has chronic post-nasal drip after covid, she uses saline prn for congestion and loratadine if needed.   Patient Active Problem List   Diagnosis Date Noted   Personal history of colonic polyps    Polyp of descending colon    History of uterine fibroid 02/15/2019   Acquired trigger finger of right middle finger 04/27/2018   Trigger finger, right middle finger 04/19/2018   Dysfunction of both eustachian tubes 04/19/2018   Chronic left lumbar radiculopathy 04/19/2018   ASCUS of cervix with negative high risk HPV 04/10/2017   Generalized anxiety disorder 09/20/2015   Allergic rhinitis 05/31/2015   Depression, major, recurrent, mild ([place]) 05/31/2015   Dyslipidemia 05/31/2015   Irritable bowel syndrome with diarrhea 05/31/2015   Female climacteric state 05/31/2015   Pituitary adenoma ([place]) 05/31/2015   Restless legs syndrome 05/31/2015   B12 deficiency 05/31/2015   Epicondylitis, lateral (tennis elbow) 05/31/2015   Tobacco use 05/03/2009   Vitamin D deficiency 05/03/2009  Past Surgical History:  Procedure Laterality Date   COLONOSCOPY WITH PROPOFOL N/A 05/31/2019   Procedure: COLONOSCOPY WITH PROPOFOL;  Surgeon: Lucilla Lame, MD;  Location[facility];  Service: Endoscopy;  Laterality: N/A;   TONSILECTOMY, ADENOIDECTOMY, BILATERAL MYRINGOTOMY AND TUBES     TUBAL LIGATION     WISDOM TOOTH EXTRACTION     patient stated that she now has a dry socket    Family History  Problem Relation Age of Onset   Hyperlipidemia Mother    Hypertension Mother    Thyroid disease Mother    Kidney disease Mother    Colon cancer Mother     Diabetes Father    Hypertension Father    CAD Father    Stroke Father    ADD / ADHD Son    Breast cancer Maternal Grandmother     Social History   Tobacco Use   Smoking status: Former    Packs/day: 0.75    Years: 35.00    Total pack years: 26.25    Types: Cigarettes   Smokeless tobacco: Former    Quit date: 08/06/2016   Tobacco comments:    switched to e-cigarettes  Substance Use Topics   Alcohol use: Yes    Comment: occasional     Current Outpatient Medications:    ALPRAZolam (XANAX) 0.5 MG tablet, Take 1 tablet (0.5 mg total) by mouth daily as needed., Disp: 45 tablet, Rfl: 0   atorvastatin (LIPITOR) 20 MG tablet, TAKE 1 TABLET BY MOUTH EVERY DAY, Disp: 90 tablet, Rfl: 1   Cholecalciferol (VITAMIN D) 50 MCG (2000 UT) CAPS, Take 1 capsule (2,000 Units total) by mouth daily., Disp: 30 capsule, Rfl: 0   Cyanocobalamin (B-12) 1000 MCG SUBL, Place 1 tablet under the tongue every other day. , Disp: , Rfl:    escitalopram (LEXAPRO) 20 MG tablet, TAKE 1 TABLET BY MOUTH EVERY DAY, Disp: 90 tablet, Rfl: 0   fluticasone (FLONASE) 50 MCG/ACT nasal spray, SPRAY 2 SPRAYS INTO EACH NOSTRIL EVERY DAY, Disp: 48 mL, Rfl: 1   loratadine (CLARITIN) 10 MG tablet, TAKE 1 TABLET BY MOUTH EVERY DAY, Disp: 90 tablet, Rfl: 1   Multiple Vitamin (MULTI-VITAMIN DAILY PO), SMARTSIG:By Mouth, Disp: , Rfl:    pregabalin (LYRICA) 50 MG capsule, Take 1-3 capsules (50-150 mg total) by mouth at bedtime., Disp: 90 capsule, Rfl: 0   Probiotic Product (PROBIOTIC-10 PO), Take by mouth., Disp: , Rfl:    Specialty Vitamins Products ([employer] MENOPAUSE SUPPORT PO), Take by mouth., Disp: , Rfl:    umeclidinium-vilanterol (ANORO ELLIPTA) 62.5-25 MCG/ACT AEPB, Inhale 1 puff into the lungs daily at 6 (six) AM., Disp: 3 each, Rfl: 1  Allergies  Allergen Reactions   Biaxin  [Clarithromycin] Other (See Comments)   Bupropion Hcl Diarrhea   Darvocet [Propoxyphene N-Acetaminophen] Nausea Only    I personally reviewed active  problem list, medication list, allergies, family history, social history, health maintenance with the patient/caregiver today.   ROS  Constitutional: Negative for fever positive for weight change a total of 56 lbs in a little over 12 months  .  Respiratory: Negative for cough and shortness of breath.   Cardiovascular: Negative for chest pain or palpitations.  Gastrointestinal: Negative for abdominal pain, no bowel changes.  Musculoskeletal: Negative for gait problem or joint swelling.  Skin: Negative for rash.  Neurological: Negative for dizziness or headache.  No other specific complaints in a complete review of systems (except as listed in HPI above).   Objective  Vitals:  07/28/22 1316  BP: 116/70  Pulse: 91  Resp: 16  SpO2: 99%  Weight: 141 lb (64 kg)  Height: '5\' 7"'$  (1.702 m)    Body mass index is 22.08 kg/m.  Physical Exam  Constitutional: Patient appears well-developed and well-nourished.  No distress.  HEENT: head atraumatic, normocephalic, pupils equal and reactive to light, neck supple Cardiovascular: Normal rate, regular rhythm and normal heart sounds.  No murmur heard. No BLE edema. Pulmonary/Chest: Effort normal and breath sounds normal. No respiratory distress. Abdominal: Soft.  There is no tenderness. Psychiatric: Patient has a normal mood and affect. behavior is normal. Judgment and thought content normal.    PHQ2/9:    07/28/2022    1:15 PM 03/11/2022   10:36 AM 11/05/2021    1:19 PM 07/10/2021   10:57 AM 03/11/2021    1:56 PM  Depression screen PHQ 2/9  Decreased Interest 0 0 0 0 2  Down, Depressed, Hopeless 0 0 0 0 2  PHQ - 2 Score 0 0 0 0 4  Altered sleeping 0 0 0 0 3  Tired, decreased energy 0 0 0 0 1  Change in appetite 0 0 0 0 0  Feeling bad or failure about yourself  0 0 0 0 0  Trouble concentrating 0 0 0 0 0  Moving slowly or fidgety/restless 0 0 0 0 0  Suicidal thoughts 0 0 0 0 0  PHQ-9 Score 0 0 0 0 8  Difficult doing work/chores  Not  difficult at all       phq 9 is negative   Fall Risk:    07/28/2022    1:15 PM 03/11/2022   10:35 AM 11/05/2021    1:18 PM 07/10/2021   10:57 AM 03/11/2021    1:56 PM  Fall Risk   Falls in the past year? 0 0 0 0 0  Number falls in past yr: 0 0 0 0 0  Injury with Fall? 0 0 0 0 0  Risk for fall due to : No Fall Risks No Fall Risks No Fall Risks No Fall Risks No Fall Risks  Follow up Falls prevention discussed Falls prevention discussed;Education provided Falls prevention discussed Falls prevention discussed Falls prevention discussed      Functional Status Survey: Is the patient deaf or have difficulty hearing?: No Does the patient have difficulty seeing, even when wearing glasses/contacts?: No Does the patient have difficulty concentrating, remembering, or making decisions?: No Does the patient have difficulty walking or climbing stairs?: No Does the patient have difficulty dressing or bathing?: No Does the patient have difficulty doing errands alone such as visiting a doctor's office or shopping?: No    Assessment & Plan  1. Centrilobular emphysema ([place])  - Fluticasone-Umeclidin-Vilant (TRELEGY ELLIPTA) 100-62.5-25 MCG/ACT AEPB; Inhale 1 puff into the lungs daily.  Dispense: 3 each; Refill: 1  2. Atherosclerosis of aorta ([place])  Advised her to resume statin therapy   3. Depression, major, in remission ([place])  Continue medication   4. Pituitary adenoma ([place])  - Prolactin  5. Vitamin D deficiency  - VITAMIN D 25 Hydroxy (Vit-D Deficiency, Fractures)  6. B12 deficiency  - Vitamin B12  7. Dyslipidemia  - Lipid panel  8. Generalized anxiety disorder  - ALPRAZolam (XANAX) 0.5 MG tablet; Take 1 tablet (0.5 mg total) by mouth daily as needed.  Dispense: 45 tablet; Refill: 0 - escitalopram (LEXAPRO) 20 MG tablet; Take 1 tablet (20 mg total) by mouth daily.  Dispense: 90 tablet; Refill: 1  9. Encounter for screening for lung cancer  She will schedule it   10.  Weight loss  - COMPLETE METABOLIC PANEL WITH GFR - TSH - HIV Antibody (routine testing w rflx) - Sedimentation rate - C-reactive protein - CBC with Differential/Platelet  11. Moderate episode of recurrent major depressive disorder (HCC)  - escitalopram (LEXAPRO) 20 MG tablet; Take 1 tablet (20 mg total) by mouth daily.  Dispense: 90 tablet; Refill: 1  12. Lumbar back pain with radiculopathy affecting left lower extremity  - pregabalin (LYRICA) 50 MG capsule; Take 1-3 capsules (50-150 mg total) by mouth at bedtime.  Dispense: 90 capsule; Refill: 0  13. Left upper quadrant pain  - CBC with Differential/Platelet - Lipase - H. pylori breath test  14. Breast cancer screening by mammogram  - MM 3D SCREEN BREAST BILATERAL; Future

## 2022-07-28 ENCOUNTER — Ambulatory Visit (INDEPENDENT_AMBULATORY_CARE_PROVIDER_SITE_OTHER): Admitting: Family Medicine

## 2022-07-28 ENCOUNTER — Encounter: Payer: Self-pay | Admitting: Family Medicine

## 2022-07-28 VITALS — BP 116/70 | HR 91 | Resp 16 | Ht 67.0 in | Wt 141.0 lb

## 2022-07-28 DIAGNOSIS — E538 Deficiency of other specified B group vitamins: Secondary | ICD-10-CM

## 2022-07-28 DIAGNOSIS — D352 Benign neoplasm of pituitary gland: Secondary | ICD-10-CM

## 2022-07-28 DIAGNOSIS — E559 Vitamin D deficiency, unspecified: Secondary | ICD-10-CM

## 2022-07-28 DIAGNOSIS — Z1231 Encounter for screening mammogram for malignant neoplasm of breast: Secondary | ICD-10-CM

## 2022-07-28 DIAGNOSIS — F325 Major depressive disorder, single episode, in full remission: Secondary | ICD-10-CM | POA: Diagnosis not present

## 2022-07-28 DIAGNOSIS — E785 Hyperlipidemia, unspecified: Secondary | ICD-10-CM

## 2022-07-28 DIAGNOSIS — Z122 Encounter for screening for malignant neoplasm of respiratory organs: Secondary | ICD-10-CM

## 2022-07-28 DIAGNOSIS — R634 Abnormal weight loss: Secondary | ICD-10-CM

## 2022-07-28 DIAGNOSIS — F411 Generalized anxiety disorder: Secondary | ICD-10-CM

## 2022-07-28 DIAGNOSIS — M5416 Radiculopathy, lumbar region: Secondary | ICD-10-CM

## 2022-07-28 DIAGNOSIS — J432 Centrilobular emphysema: Secondary | ICD-10-CM | POA: Diagnosis not present

## 2022-07-28 DIAGNOSIS — F331 Major depressive disorder, recurrent, moderate: Secondary | ICD-10-CM

## 2022-07-28 DIAGNOSIS — I700 Atherosclerosis of aorta: Secondary | ICD-10-CM

## 2022-07-28 DIAGNOSIS — R1012 Left upper quadrant pain: Secondary | ICD-10-CM

## 2022-07-28 MED ORDER — PREGABALIN 50 MG PO CAPS: 50.0000 mg | ORAL_CAPSULE | Freq: Every day | ORAL | 0 refills | Status: DC

## 2022-07-28 MED ORDER — ESCITALOPRAM OXALATE 20 MG PO TABS: 20.0000 mg | ORAL_TABLET | Freq: Every day | ORAL | 1 refills | Status: DC

## 2022-07-28 MED ORDER — ALPRAZOLAM 0.5 MG PO TABS: 0.5000 mg | ORAL_TABLET | Freq: Every day | ORAL | 0 refills | Status: DC | PRN

## 2022-07-28 MED ORDER — TRELEGY ELLIPTA 100-62.5-25 MCG/ACT IN AEPB: 1.0000 | INHALATION_SPRAY | Freq: Every day | RESPIRATORY_TRACT | 1 refills | Status: DC

## 2022-07-28 NOTE — Patient Instructions (Signed)
[facility]: [phone]

## 2022-07-29 ENCOUNTER — Encounter: Payer: Self-pay | Admitting: Family Medicine

## 2022-07-30 ENCOUNTER — Other Ambulatory Visit: Payer: Self-pay | Admitting: Family Medicine

## 2022-07-30 DIAGNOSIS — Z122 Encounter for screening for malignant neoplasm of respiratory organs: Secondary | ICD-10-CM

## 2022-07-30 DIAGNOSIS — J432 Centrilobular emphysema: Secondary | ICD-10-CM

## 2022-07-31 LAB — COMPLETE METABOLIC PANEL WITH GFR
AG Ratio: 2 (calc) (ref 1.0–2.5)
ALT: 10 U/L (ref 6–29)
AST: 12 U/L (ref 10–35)
Albumin: 4.2 g/dL (ref 3.6–5.1)
Alkaline phosphatase (APISO): 53 U/L (ref 37–153)
BUN: 11 mg/dL (ref 7–25)
CO2: 30 mmol/L (ref 20–32)
Calcium: 9.7 mg/dL (ref 8.6–10.4)
Chloride: 106 mmol/L (ref 98–110)
Creat: 0.94 mg/dL (ref 0.50–1.03)
Globulin: 2.1 g/dL (calc) (ref 1.9–3.7)
Glucose, Bld: 79 mg/dL (ref 65–99)
Potassium: 4.2 mmol/L (ref 3.5–5.3)
Sodium: 142 mmol/L (ref 135–146)
Total Bilirubin: 0.5 mg/dL (ref 0.2–1.2)
Total Protein: 6.3 g/dL (ref 6.1–8.1)
eGFR: 71 mL/min/{1.73_m2} (ref >=60)

## 2022-07-31 LAB — LIPID PANEL
Cholesterol: 249 mg/dL — ABNORMAL HIGH (ref <200)
HDL: 65 mg/dL (ref >=50)
LDL Cholesterol (Calc): 169 mg/dL (calc) — ABNORMAL HIGH
Non-HDL Cholesterol (Calc): 184 mg/dL (calc) — ABNORMAL HIGH (ref <130)
Total CHOL/HDL Ratio: 3.8 (calc) (ref <5.0)
Triglycerides: 62 mg/dL (ref <150)

## 2022-07-31 LAB — CBC WITH DIFFERENTIAL/PLATELET
Absolute Monocytes: 383 cells/uL (ref 200–950)
Basophils Absolute: 48 cells/uL (ref 0–200)
Basophils Relative: 1.1 %
Eosinophils Absolute: 101 cells/uL (ref 15–500)
Eosinophils Relative: 2.3 %
HCT: 38.7 % (ref 35.0–45.0)
Hemoglobin: 13.2 g/dL (ref 11.7–15.5)
Lymphs Abs: 1610 cells/uL (ref 850–3900)
MCH: 33 pg (ref 27.0–33.0)
MCHC: 34.1 g/dL (ref 32.0–36.0)
MCV: 96.8 fL (ref 80.0–100.0)
MPV: 10.2 fL (ref 7.5–12.5)
Monocytes Relative: 8.7 %
Neutro Abs: 2257 cells/uL (ref 1500–7800)
Neutrophils Relative %: 51.3 %
Platelets: 241 10*3/uL (ref 140–400)
RBC: 4 10*6/uL (ref 3.80–5.10)
RDW: 11.9 % (ref 11.0–15.0)
Total Lymphocyte: 36.6 %
WBC: 4.4 10*3/uL (ref 3.8–10.8)

## 2022-07-31 LAB — H. PYLORI BREATH TEST: H. pylori Breath Test: NOT DETECTED

## 2022-07-31 LAB — VITAMIN B12: Vitamin B-12: 543 pg/mL (ref 200–1100)

## 2022-07-31 LAB — TSH: TSH: 1.4 mIU/L (ref 0.40–4.50)

## 2022-07-31 LAB — VITAMIN D 25 HYDROXY (VIT D DEFICIENCY, FRACTURES): Vit D, 25-Hydroxy: 40 ng/mL (ref 30–100)

## 2022-07-31 LAB — LIPASE: Lipase: 18 U/L (ref 7–60)

## 2022-07-31 LAB — HIV ANTIBODY (ROUTINE TESTING W REFLEX): HIV 1&2 Ab, 4th Generation: NONREACTIVE

## 2022-07-31 LAB — PROLACTIN: Prolactin: 7.1 ng/mL

## 2022-07-31 LAB — SEDIMENTATION RATE: Sed Rate: 6 mm/h (ref 0–30)

## 2022-07-31 LAB — C-REACTIVE PROTEIN: CRP: <0.2 mg/L (ref <8.0)

## 2022-08-01 ENCOUNTER — Encounter: Payer: Self-pay | Admitting: Family Medicine

## 2022-08-01 ENCOUNTER — Other Ambulatory Visit: Payer: Self-pay

## 2022-08-01 DIAGNOSIS — J302 Other seasonal allergic rhinitis: Secondary | ICD-10-CM

## 2022-08-01 MED ORDER — LORATADINE 10 MG PO TABS: 10.0000 mg | ORAL_TABLET | Freq: Every day | ORAL | 1 refills | Status: DC

## 2022-08-06 ENCOUNTER — Encounter: Payer: Self-pay | Admitting: Family Medicine

## 2022-08-07 ENCOUNTER — Other Ambulatory Visit: Payer: Self-pay

## 2022-08-07 DIAGNOSIS — Z87891 Personal history of nicotine dependence: Secondary | ICD-10-CM

## 2022-08-07 DIAGNOSIS — Z122 Encounter for screening for malignant neoplasm of respiratory organs: Secondary | ICD-10-CM

## 2022-08-11 ENCOUNTER — Telehealth: Payer: Self-pay | Admitting: Family Medicine

## 2022-08-11 ENCOUNTER — Other Ambulatory Visit: Payer: Self-pay | Admitting: Family Medicine

## 2022-08-11 ENCOUNTER — Other Ambulatory Visit: Payer: Self-pay

## 2022-08-11 ENCOUNTER — Encounter: Payer: Self-pay | Admitting: Family Medicine

## 2022-08-11 DIAGNOSIS — J302 Other seasonal allergic rhinitis: Secondary | ICD-10-CM

## 2022-08-11 DIAGNOSIS — F411 Generalized anxiety disorder: Secondary | ICD-10-CM

## 2022-08-11 MED ORDER — LORATADINE 10 MG PO TABS: 10.0000 mg | ORAL_TABLET | Freq: Every day | ORAL | 1 refills | Status: DC

## 2022-08-11 NOTE — Telephone Encounter (Signed)
Spoke with patient and she is going to contact her Software engineer and find out status and get back with [place].

## 2022-08-11 NOTE — Telephone Encounter (Signed)
Scott calling from [place] is returning Cynthia Bean's call regarding ALPRAZolam (XANAX) 0.5 MG tablet [[id number]]  CB- [place] Time. Will not be in until 10a EST.

## 2022-08-12 NOTE — Telephone Encounter (Signed)
Returned call to [employer]. He stated he just wanted to follow up and let us know patient's prescription would be filled and sent asap.

## 2022-08-19 ENCOUNTER — Ambulatory Visit
Admission: RE | Admit: 2022-08-19 | Discharge: 2022-08-19 | Disposition: A | Discharge status: Home / Self Care | Source: Ambulatory Visit | Attending: Family Medicine | Admitting: Family Medicine

## 2022-08-19 DIAGNOSIS — Z87891 Personal history of nicotine dependence: Secondary | ICD-10-CM

## 2022-08-19 DIAGNOSIS — Z122 Encounter for screening for malignant neoplasm of respiratory organs: Secondary | ICD-10-CM | POA: Diagnosis present

## 2022-08-20 ENCOUNTER — Other Ambulatory Visit: Payer: Self-pay

## 2022-08-20 DIAGNOSIS — Z87891 Personal history of nicotine dependence: Secondary | ICD-10-CM

## 2022-08-20 DIAGNOSIS — Z122 Encounter for screening for malignant neoplasm of respiratory organs: Secondary | ICD-10-CM

## 2022-10-18 ENCOUNTER — Other Ambulatory Visit: Payer: Self-pay | Admitting: Family Medicine

## 2022-10-18 DIAGNOSIS — F331 Major depressive disorder, recurrent, moderate: Secondary | ICD-10-CM

## 2022-10-18 DIAGNOSIS — F411 Generalized anxiety disorder: Secondary | ICD-10-CM

## 2022-10-31 ENCOUNTER — Other Ambulatory Visit: Payer: Self-pay | Admitting: Family Medicine

## 2022-10-31 DIAGNOSIS — I700 Atherosclerosis of aorta: Secondary | ICD-10-CM

## 2022-10-31 DIAGNOSIS — E785 Hyperlipidemia, unspecified: Secondary | ICD-10-CM

## 2022-11-01 ENCOUNTER — Encounter: Payer: Self-pay | Admitting: Family Medicine

## 2022-11-03 ENCOUNTER — Other Ambulatory Visit: Payer: Self-pay

## 2022-11-03 DIAGNOSIS — I700 Atherosclerosis of aorta: Secondary | ICD-10-CM

## 2022-11-03 DIAGNOSIS — E785 Hyperlipidemia, unspecified: Secondary | ICD-10-CM

## 2022-11-03 MED ORDER — ATORVASTATIN CALCIUM 20 MG PO TABS: 20.0000 mg | ORAL_TABLET | Freq: Every day | ORAL | 1 refills | Status: DC

## 2022-12-01 ENCOUNTER — Encounter: Payer: Self-pay | Admitting: Family Medicine

## 2022-12-16 ENCOUNTER — Ambulatory Visit
Admission: RE | Admit: 2022-12-16 | Discharge: 2022-12-16 | Disposition: A | Discharge status: Home / Self Care | Source: Ambulatory Visit | Attending: Family Medicine | Admitting: Family Medicine

## 2022-12-16 DIAGNOSIS — Z1231 Encounter for screening mammogram for malignant neoplasm of breast: Secondary | ICD-10-CM | POA: Diagnosis present

## 2022-12-29 NOTE — Progress Notes (Unsigned)
Name: Cynthia Bean   MRN: [id number]    DOB: 1966/05/15   Date:12/30/2022       Progress Note  Subjective  Chief Complaint  Follow Up  HPI  GAD/MDD: she states still worried about her mother that is at hospice home in [place], husband retired, brother came to visit them this past weekend and see their mother, she is worried that she could be hanging on for her. She is taking lexapro, takes alprazolam half dose prn during the day and has ben taking half pill at night to sleep ( help with neuropathy legs and RLS  ) she is not taking Lyrica   B12 deficiency/Vitamin D deficiency: last level was normal , continue supplementation   Lower back pain: she noticed left lower back pain intermittent for years, she states it was severe years ago, it was worse when she was driving recently.    Pituitary adenoma: last MRI was done 10/12/2015 and size was stable. She denies  double visions or headaches, no nipple discharge Discussed repeating with her but she wants to hold off for now    Dyslipidemia/Atherosclerosis aorta : she had stopped taking Atorvastatin 20 mg daily due to being on a low carb diet but LDL spiked to 169, she is taking it again now, plus CoQ10 and fish oil   Emphysema: on CT lung done 22 , she has noticed sob with activity, no longer smoking cigarettes , quit smoking in  2018 still vaping but with very little nicotine now We gave her Trelegy but it caused thrush  she stopped using Anoro because it not covered , we gave her Trelegy but she is afraid of side effects - explained the absorption of steroids is very low.   Weight loss/Abdominal pain  she developed intermittent LUQ pain around March 2023 a soreness sensation that happens intermittent  when she seats down after being active. No change in bowel movements, no nausea or vomiting. She started Atkins diet in January 23  She had  lost a lot of weight 56 lbs since Dec 2022 . Labs done Summer 2023 were normal but discussed CT abdomen  and or referral to GI since it is a significant amount of weight loss. She states pain is not as often now and happy with weight loss. Reviewed labs we checked back in January and was normal. Weight is now stable. She is still eating a very low carb diet   AR : she has chronic post-nasal drip after covid, she uses saline prn for congestion and loratadine if needed. Unchanged  Patient Active Problem List   Diagnosis Date Noted   Centrilobular emphysema (HCC) 07/28/2022   Atherosclerosis of aorta (HCC) 07/28/2022   Personal history of colonic polyps    Polyp of descending colon    History of uterine fibroid 02/15/2019   Acquired trigger finger of right middle finger 04/27/2018   Trigger finger, right middle finger 04/19/2018   Dysfunction of both eustachian tubes 04/19/2018   Chronic left lumbar radiculopathy 04/19/2018   ASCUS of cervix with negative high risk HPV 04/10/2017   Generalized anxiety disorder 09/20/2015   Allergic rhinitis 05/31/2015   Depression, major, recurrent, mild (HCC) 05/31/2015   Dyslipidemia 05/31/2015   Irritable bowel syndrome with diarrhea 05/31/2015   Female climacteric state 05/31/2015   Pituitary adenoma (HCC) 05/31/2015   Restless legs syndrome 05/31/2015   B12 deficiency 05/31/2015   Epicondylitis, lateral (tennis elbow) 05/31/2015   Tobacco use 05/03/2009   Vitamin D deficiency  05/03/2009    Past Surgical History:  Procedure Laterality Date   COLONOSCOPY WITH PROPOFOL N/A 05/31/2019   Procedure: COLONOSCOPY WITH PROPOFOL;  Surgeon: Midge Minium, MD;  Location: [facility] ENDOSCOPY;  Service: Endoscopy;  Laterality: N/A;   TONSILECTOMY, ADENOIDECTOMY, BILATERAL MYRINGOTOMY AND TUBES     TUBAL LIGATION     WISDOM TOOTH EXTRACTION     patient stated that she now has a dry socket    Family History  Problem Relation Age of Onset   Hyperlipidemia Mother    Hypertension Mother    Thyroid disease Mother    Kidney disease Mother    Colon cancer Mother     Diabetes Father    Hypertension Father    CAD Father    Stroke Father    ADD / ADHD Son    Breast cancer Maternal Grandmother     Social History   Tobacco Use   Smoking status: Former    Packs/day: 0.75    Years: 35.00    Additional pack years: 0.00    Total pack years: 26.25    Types: Cigarettes   Smokeless tobacco: Former    Quit date: 08/06/2016   Tobacco comments:    switched to e-cigarettes  Substance Use Topics   Alcohol use: Yes    Comment: occasional     Current Outpatient Medications:    ALPRAZolam (XANAX) 0.5 MG tablet, Take 1 tablet (0.5 mg total) by mouth daily as needed., Disp: 45 tablet, Rfl: 0   atorvastatin (LIPITOR) 20 MG tablet, Take 1 tablet (20 mg total) by mouth daily., Disp: 90 tablet, Rfl: 1   Cholecalciferol (VITAMIN D) 50 MCG (2000 UT) CAPS, Take 1 capsule (2,000 Units total) by mouth daily., Disp: 30 capsule, Rfl: 0   Cyanocobalamin (B-12) 1000 MCG SUBL, Place 1 tablet under the tongue every other day. , Disp: , Rfl:    escitalopram (LEXAPRO) 20 MG tablet, Take 1 tablet (20 mg total) by mouth daily., Disp: 90 tablet, Rfl: 1   fluticasone (FLONASE) 50 MCG/ACT nasal spray, SPRAY 2 SPRAYS INTO EACH NOSTRIL EVERY DAY, Disp: 48 mL, Rfl: 1   Fluticasone-Umeclidin-Vilant (TRELEGY ELLIPTA) 100-62.5-25 MCG/ACT AEPB, Inhale 1 puff into the lungs daily., Disp: 3 each, Rfl: 1   loratadine (CLARITIN) 10 MG tablet, Take 1 tablet (10 mg total) by mouth daily., Disp: 90 tablet, Rfl: 1   Multiple Vitamin (MULTI-VITAMIN DAILY PO), SMARTSIG:By Mouth, Disp: , Rfl:    pregabalin (LYRICA) 50 MG capsule, Take 1-3 capsules (50-150 mg total) by mouth at bedtime., Disp: 90 capsule, Rfl: 0   Probiotic Product (PROBIOTIC-10 PO), Take by mouth., Disp: , Rfl:    Specialty Vitamins Products ([employer] MENOPAUSE SUPPORT PO), Take by mouth., Disp: , Rfl:   Allergies  Allergen Reactions   Biaxin  [Clarithromycin] Other (See Comments)   Bupropion Hcl Diarrhea   Darvocet [Propoxyphene  N-Acetaminophen] Nausea Only    I personally reviewed active problem list, medication list, allergies, family history, social history, health maintenance with the patient/caregiver today.   ROS  Constitutional: Negative for fever or weight change.  Respiratory: Negative for cough and shortness of breath.   Cardiovascular: Negative for chest pain or palpitations.  Gastrointestinal: Negative for abdominal pain, no bowel changes.  Musculoskeletal: Negative for gait problem or joint swelling.  Skin: Negative for rash.  Neurological: Negative for dizziness or headache.  No other specific complaints in a complete review of systems (except as listed in HPI above).   Objective  Vitals:  12/30/22 1317  BP: 112/68  Pulse: 76  Resp: 14  Temp: 97.7 F (36.5 C)  TempSrc: Oral  SpO2: 99%  Weight: 140 lb 8 oz (63.7 kg)  Height: 5\' 7"  (1.702 m)    Body mass index is 22.01 kg/m.  Physical Exam  Constitutional: Patient appears well-developed and well-nourished. Obese  No distress.  HEENT: head atraumatic, normocephalic, pupils equal and reactive to light,, neck supple, Cardiovascular: Normal rate, regular rhythm and normal heart sounds.  No murmur heard. No BLE edema. Pulmonary/Chest: Effort normal and breath sounds normal. No respiratory distress. Abdominal: Soft.  There is no tenderness. Psychiatric: Patient has a normal mood and affect. behavior is normal. Judgment and thought content normal.     PHQ2/9:    12/30/2022    1:26 PM 07/28/2022    1:15 PM 03/11/2022   10:36 AM 11/05/2021    1:19 PM 07/10/2021   10:57 AM  Depression screen PHQ 2/9  Decreased Interest 0 0 0 0 0  Down, Depressed, Hopeless 0 0 0 0 0  PHQ - 2 Score 0 0 0 0 0  Altered sleeping 0 0 0 0 0  Tired, decreased energy 0 0 0 0 0  Change in appetite 0 0 0 0 0  Feeling bad or failure about yourself  0 0 0 0 0  Trouble concentrating 2 0 0 0 0  Moving slowly or fidgety/restless 2 0 0 0 0  Suicidal thoughts 0 0  0 0 0  PHQ-9 Score 4 0 0 0 0  Difficult doing work/chores Not difficult at all  Not difficult at all      phq 9 is negative   Fall Risk:    12/30/2022    1:17 PM 07/28/2022    1:15 PM 03/11/2022   10:35 AM 11/05/2021    1:18 PM 07/10/2021   10:57 AM  Fall Risk   Falls in the past year? 0 0 0 0 0  Number falls in past yr:  0 0 0 0  Injury with Fall?  0 0 0 0  Risk for fall due to : No Fall Risks No Fall Risks No Fall Risks No Fall Risks No Fall Risks  Follow up Falls prevention discussed Falls prevention discussed Falls prevention discussed;Education provided Falls prevention discussed Falls prevention discussed      Functional Status Survey: Is the patient deaf or have difficulty hearing?: No Does the patient have difficulty seeing, even when wearing glasses/contacts?: No Does the patient have difficulty concentrating, remembering, or making decisions?: No Does the patient have difficulty walking or climbing stairs?: No Does the patient have difficulty dressing or bathing?: No Does the patient have difficulty doing errands alone such as visiting a doctor's office or shopping?: No    Assessment & Plan  1. Pituitary adenoma (HCC)  Last prolactin was at goal   2. Centrilobular emphysema (HCC)  Up to date with CT lung cancer screen She will try resuming Trelegy - reminded her to rinse her mouth and if sob resolves no need to Coronary calcium score   3. Depression, major, in remission (HCC)  - escitalopram (LEXAPRO) 20 MG tablet; Take 1 tablet (20 mg total) by mouth daily.  Dispense: 90 tablet; Refill: 1  4. Atherosclerosis of aorta (HCC)  She is back on statin therapy   5. Generalized anxiety disorder  - ALPRAZolam (XANAX) 0.5 MG tablet; Take 1 tablet (0.5 mg total) by mouth daily as needed.  Dispense: 45 tablet; Refill: 0 -  escitalopram (LEXAPRO) 20 MG tablet; Take 1 tablet (20 mg total) by mouth daily.  Dispense: 90 tablet; Refill: 1  6. Left upper quadrant  pain  Refusing further testing   7. B12 deficiency

## 2022-12-30 ENCOUNTER — Ambulatory Visit (INDEPENDENT_AMBULATORY_CARE_PROVIDER_SITE_OTHER): Admitting: Family Medicine

## 2022-12-30 VITALS — BP 112/68 | HR 76 | Temp 97.7°F | Resp 14 | Ht 67.0 in | Wt 140.5 lb

## 2022-12-30 DIAGNOSIS — E538 Deficiency of other specified B group vitamins: Secondary | ICD-10-CM

## 2022-12-30 DIAGNOSIS — I700 Atherosclerosis of aorta: Secondary | ICD-10-CM

## 2022-12-30 DIAGNOSIS — F325 Major depressive disorder, single episode, in full remission: Secondary | ICD-10-CM | POA: Insufficient documentation

## 2022-12-30 DIAGNOSIS — J432 Centrilobular emphysema: Secondary | ICD-10-CM | POA: Diagnosis not present

## 2022-12-30 DIAGNOSIS — R1012 Left upper quadrant pain: Secondary | ICD-10-CM

## 2022-12-30 DIAGNOSIS — D352 Benign neoplasm of pituitary gland: Secondary | ICD-10-CM | POA: Diagnosis not present

## 2022-12-30 DIAGNOSIS — F411 Generalized anxiety disorder: Secondary | ICD-10-CM

## 2022-12-30 MED ORDER — ESCITALOPRAM OXALATE 20 MG PO TABS: 20.0000 mg | ORAL_TABLET | Freq: Every day | ORAL | 1 refills | Status: DC

## 2022-12-30 MED ORDER — ALPRAZOLAM 0.5 MG PO TABS: 0.5000 mg | ORAL_TABLET | Freq: Every day | ORAL | 0 refills | Status: DC | PRN

## 2023-01-29 ENCOUNTER — Encounter: Payer: Self-pay | Admitting: Family Medicine

## 2023-03-01 ENCOUNTER — Encounter: Payer: Self-pay | Admitting: Family Medicine

## 2023-03-16 ENCOUNTER — Encounter: Payer: Self-pay | Admitting: Emergency Medicine

## 2023-03-16 ENCOUNTER — Other Ambulatory Visit: Payer: Self-pay

## 2023-03-16 ENCOUNTER — Emergency Department
Admission: EM | Admit: 2023-03-16 | Discharge: 2023-03-16 | Disposition: A | Discharge status: Home / Self Care | Attending: Emergency Medicine | Admitting: Emergency Medicine

## 2023-03-16 DIAGNOSIS — E876 Hypokalemia: Secondary | ICD-10-CM | POA: Diagnosis not present

## 2023-03-16 DIAGNOSIS — F17200 Nicotine dependence, unspecified, uncomplicated: Secondary | ICD-10-CM | POA: Diagnosis not present

## 2023-03-16 DIAGNOSIS — R531 Weakness: Secondary | ICD-10-CM | POA: Insufficient documentation

## 2023-03-16 DIAGNOSIS — J439 Emphysema, unspecified: Secondary | ICD-10-CM | POA: Insufficient documentation

## 2023-03-16 LAB — CBC WITH DIFFERENTIAL/PLATELET
Abs Immature Granulocytes: 0.02 10*3/uL (ref 0.00–0.07)
Basophils Absolute: 0 10*3/uL (ref 0.0–0.1)
Basophils Relative: 1 %
Eosinophils Absolute: 0.1 10*3/uL (ref 0.0–0.5)
Eosinophils Relative: 1 %
HCT: 42.4 % (ref 36.0–46.0)
Hemoglobin: 14 g/dL (ref 12.0–15.0)
Immature Granulocytes: 0 %
Lymphocytes Relative: 34 %
Lymphs Abs: 1.8 10*3/uL (ref 0.7–4.0)
MCH: 32.3 pg (ref 26.0–34.0)
MCHC: 33 g/dL (ref 30.0–36.0)
MCV: 97.9 fL (ref 80.0–100.0)
Monocytes Absolute: 0.6 10*3/uL (ref 0.1–1.0)
Monocytes Relative: 12 %
Neutro Abs: 2.8 10*3/uL (ref 1.7–7.7)
Neutrophils Relative %: 52 %
Platelets: 194 10*3/uL (ref 150–400)
RBC: 4.33 MIL/uL (ref 3.87–5.11)
RDW: 12.3 % (ref 11.5–15.5)
WBC: 5.4 10*3/uL (ref 4.0–10.5)
nRBC: 0 % (ref 0.0–0.2)

## 2023-03-16 LAB — HEPATIC FUNCTION PANEL
ALT: 25 U/L (ref 0–44)
AST: 22 U/L (ref 15–41)
Albumin: 3.7 g/dL (ref 3.5–5.0)
Alkaline Phosphatase: 52 U/L (ref 38–126)
Bilirubin, Direct: 0.1 mg/dL (ref 0.0–0.2)
Indirect Bilirubin: 0.3 mg/dL (ref 0.3–0.9)
Total Bilirubin: 0.4 mg/dL (ref 0.3–1.2)
Total Protein: 6.9 g/dL (ref 6.5–8.1)

## 2023-03-16 LAB — LIPASE, BLOOD: Lipase: 40 U/L (ref 11–51)

## 2023-03-16 LAB — BASIC METABOLIC PANEL
Anion gap: 8 (ref 5–15)
BUN: 7 mg/dL (ref 6–20)
CO2: 26 mmol/L (ref 22–32)
Calcium: 9.1 mg/dL (ref 8.9–10.3)
Chloride: 105 mmol/L (ref 98–111)
Creatinine, Ser: 0.8 mg/dL (ref 0.44–1.00)
GFR, Estimated: >60 mL/min (ref >60)
Glucose, Bld: 96 mg/dL (ref 70–99)
Potassium: 3.4 mmol/L — ABNORMAL LOW (ref 3.5–5.1)
Sodium: 139 mmol/L (ref 135–145)

## 2023-03-16 MED ORDER — ONDANSETRON HCL 4 MG/2ML IJ SOLN
4.0000 mg | Freq: Once | INTRAMUSCULAR | Status: AC
  Administered 2023-03-16: 4 mg via INTRAVENOUS
  Filled 2023-03-16: qty 2

## 2023-03-16 MED ORDER — SODIUM CHLORIDE 0.9 % IV BOLUS
1000.0000 mL | Freq: Once | INTRAVENOUS | Status: AC
  Administered 2023-03-16: 1000 mL via INTRAVENOUS

## 2023-03-16 MED ORDER — POTASSIUM CHLORIDE 20 MEQ PO PACK
40.0000 meq | PACK | Freq: Two times a day (BID) | ORAL | Status: DC
  Administered 2023-03-16: 40 meq via ORAL
  Filled 2023-03-16: qty 2

## 2023-03-16 NOTE — Discharge Instructions (Signed)
Your potassium was found to be low and you were given oral potassium to fix this problem.  Please follow-up with your outpatient provider.  Please return for any new, worsening, or changing symptoms or other concerns.  It was a pleasure caring for you today.

## 2023-03-16 NOTE — ED Triage Notes (Signed)
Patient arrives ambulatory by POV stating last Wednesday eating something bad causing her to vomit 6 times and have diarrhea. C/o feeling weak and dizzy ever since. No more emesis since Wednesday. Has been eating and drinking since then.

## 2023-03-16 NOTE — ED Provider Notes (Signed)
[facility] Provider Note    Event Date/Time   First MD Initiated Contact with Patient 03/16/23 1146     (approximate)   History   Weakness   HPI  Cynthia Bean is a 57 y.o. female with a past medical history of hyperlipidemia, anxiety, emphysema, depression who presents today for evaluation of nausea, weakness for the past 4 to 5 days.  Patient reports that her symptoms began after she had a sandwich and thinks that she got food poisoning because she had 6 episodes of vomiting immediately after and also diarrhea.  She reports that she has been taking Pepto-Bismol.  She feels that the diarrhea has persisted though it is improved.  Her vomiting has resolved.  She reports that she still feels nauseated and weak.  No fevers or chills.  No abdominal pain.  Patient Active Problem List   Diagnosis Date Noted   Depression, major, in remission (HCC) 12/30/2022   Centrilobular emphysema (HCC) 07/28/2022   Atherosclerosis of aorta (HCC) 07/28/2022   Personal history of colonic polyps    Polyp of descending colon    History of uterine fibroid 02/15/2019   Acquired trigger finger of right middle finger 04/27/2018   Trigger finger, right middle finger 04/19/2018   Dysfunction of both eustachian tubes 04/19/2018   Chronic left lumbar radiculopathy 04/19/2018   ASCUS of cervix with negative high risk HPV 04/10/2017   Generalized anxiety disorder 09/20/2015   Allergic rhinitis 05/31/2015   Depression, major, recurrent, mild (HCC) 05/31/2015   Dyslipidemia 05/31/2015   Irritable bowel syndrome with diarrhea 05/31/2015   Female climacteric state 05/31/2015   Pituitary adenoma (HCC) 05/31/2015   Restless legs syndrome 05/31/2015   B12 deficiency 05/31/2015   Epicondylitis, lateral (tennis elbow) 05/31/2015   Tobacco use 05/03/2009   Vitamin D deficiency 05/03/2009          Physical Exam   Triage Vital Signs: ED Triage Vitals  Encounter Vitals Group     BP  03/16/23 1143 132/73     Systolic BP Percentile --      Diastolic BP Percentile --      Pulse Rate 03/16/23 1143 83     Resp 03/16/23 1143 18     Temp 03/16/23 1144 98.6 F (37 C)     Temp Source 03/16/23 1144 Oral     SpO2 03/16/23 1143 98 %     Weight 03/16/23 1144 140 lb (63.5 kg)     Height 03/16/23 1144 5\' 7"  (1.702 m)     Head Circumference --      Peak Flow --      Pain Score 03/16/23 1143 4     Pain Loc --      Pain Education --      Exclude from Growth Chart --     Most recent vital signs: Vitals:   03/16/23 1143 03/16/23 1144  BP: 132/73   Pulse: 83   Resp: 18   Temp:  98.6 F (37 C)  SpO2: 98%     Physical Exam Vitals and nursing note reviewed.  Constitutional:      General: Awake and alert. No acute distress.    Appearance: Normal appearance. The patient is normal weight.  HENT:     Head: Normocephalic and atraumatic.     Mouth: Mucous membranes are moist.  Eyes:     General: PERRL. Normal EOMs        Right eye: No discharge.  Left eye: No discharge.     Conjunctiva/sclera: Conjunctivae normal.  Cardiovascular:     Rate and Rhythm: Normal rate and regular rhythm.     Pulses: Normal pulses.  Pulmonary:     Effort: Pulmonary effort is normal. No respiratory distress.     Breath sounds: Normal breath sounds.  Abdominal:     Abdomen is soft. There is no abdominal tenderness. No rebound or guarding. No distention. Musculoskeletal:        General: No swelling. Normal range of motion.     Cervical back: Normal range of motion and neck supple.  Skin:    General: Skin is warm and dry.     Capillary Refill: Capillary refill takes less than 2 seconds.     Findings: No rash.  Neurological:     Mental Status: The patient is awake and alert.      ED Results / Procedures / Treatments   Labs (all labs ordered are listed, but only abnormal results are displayed) Labs Reviewed  BASIC METABOLIC PANEL - Abnormal; Notable for the following components:       Result Value   Potassium 3.4 (*)    All other components within normal limits  CBC WITH DIFFERENTIAL/PLATELET  HEPATIC FUNCTION PANEL  LIPASE, BLOOD     EKG     RADIOLOGY     PROCEDURES:  Critical Care performed:   Procedures   MEDICATIONS ORDERED IN ED: Medications  potassium chloride (KLOR-CON) packet 40 mEq (40 mEq Oral Given 03/16/23 1250)  sodium chloride 0.9 % bolus 1,000 mL (0 mLs Intravenous Stopped 03/16/23 1253)  ondansetron (ZOFRAN) injection 4 mg (4 mg Intravenous Given 03/16/23 1214)     IMPRESSION / MDM / ASSESSMENT AND PLAN / ED COURSE  I reviewed the triage vital signs and the nursing notes.   Differential diagnosis includes, but is not limited to, dehydration, electrolyte disarray, gastroenteritis, diverticulitis.  Patient is overall well-appearing. No abdominal pain or tenderness. Vomiting and diarrhea are non-bloody. Patient is hemodynamically stable. No history of immunosuppression, no other red flags such as recent travel, sick contacts or recent antibiotic use. Improved with treatment in the emergency department and tolerating oral intake. Differential diagnosis is broad however without focal abdominal tenderness and given improvement and overall normal blood work, no concern that the patient requires urgent imaging or has surgical process in abdomen. Given that patient has a paucity of red flags for the vomiting and diarrhea as it pertains to patient's past medical history and history of present illness, no indication for further observation or empiric antibiotic treatment.  She was found to be hypokalemic, and this was repleted.  Discussed care plan, return precautions, and advised close outpatient follow-up. Patient agrees with plan of care.   Patient's presentation is most consistent with acute complicated illness / injury requiring diagnostic workup.     FINAL CLINICAL IMPRESSION(S) / ED DIAGNOSES   Final diagnoses:  Hypokalemia  Weakness      Rx / DC Orders   ED Discharge Orders     None        Note:  This document was prepared using Dragon voice recognition software and may include unintentional dictation errors.   Jackelyn Hoehn, PA-C 03/16/23 1325    Pilar Jarvis, MD 03/16/23 [phone]

## 2023-03-19 ENCOUNTER — Ambulatory Visit: Admitting: Nurse Practitioner

## 2023-03-19 NOTE — Progress Notes (Deleted)
LMP 11/16/2017 (Approximate)    Subjective:    Patient ID: Cynthia Bean, female    DOB: 20-Oct-1965, 57 y.o.   MRN: [id number]  HPI: Cynthia Bean is a 57 y.o. female  No chief complaint on file.  Er follow up/weakness/hypokalemia/diarrhea/nausea/vomiting: seen on 03/16/2023 at [facility]. She was seen in the er for nausea, vomiting, weakness and diarrhea. Patient had reported she thought she had food poisoning because she became ill after eating a sandwich.  Patient had labs done which showed some hypokalemia,  potassium was replaced in er.  Patient here today reporting ***  Relevant past medical, surgical, family and social history reviewed and updated as indicated. Interim medical history since our last visit reviewed. Allergies and medications reviewed and updated.  Review of Systems  Constitutional: Negative for fever or weight change.  Respiratory: Negative for cough and shortness of breath.   Cardiovascular: Negative for chest pain or palpitations.  Gastrointestinal: Negative for abdominal pain, no bowel changes.  Musculoskeletal: Negative for gait problem or joint swelling.  Skin: Negative for rash.  Neurological: Negative for dizziness or headache.  No other specific complaints in a complete review of systems (except as listed in HPI above).      Objective:    LMP 11/16/2017 (Approximate)   Wt Readings from Last 3 Encounters:  03/16/23 140 lb (63.5 kg)  12/30/22 140 lb 8 oz (63.7 kg)  07/28/22 141 lb (64 kg)    Physical Exam  Constitutional: Patient appears well-developed and well-nourished. Obese *** No distress.  HEENT: head atraumatic, normocephalic, pupils equal and reactive to light, ears ***, neck supple, throat within normal limits Cardiovascular: Normal rate, regular rhythm and normal heart sounds.  No murmur heard. No BLE edema. Pulmonary/Chest: Effort normal and breath sounds normal. No respiratory distress. Abdominal: Soft.  There is no  tenderness. Psychiatric: Patient has a normal mood and affect. behavior is normal. Judgment and thought content normal.  Results for orders placed or performed during the hospital encounter of 03/16/23  CBC with Differential  Result Value Ref Range   WBC 5.4 4.0 - 10.5 K/uL   RBC 4.33 3.87 - 5.11 MIL/uL   Hemoglobin 14.0 12.0 - 15.0 g/dL   HCT 62.9 52.8 - 41.3 %   MCV 97.9 80.0 - 100.0 fL   MCH 32.3 26.0 - 34.0 pg   MCHC 33.0 30.0 - 36.0 g/dL   RDW 24.4 01.0 - 27.2 %   Platelets 194 150 - 400 K/uL   nRBC 0.0 0.0 - 0.2 %   Neutrophils Relative % 52 %   Neutro Abs 2.8 1.7 - 7.7 K/uL   Lymphocytes Relative 34 %   Lymphs Abs 1.8 0.7 - 4.0 K/uL   Monocytes Relative 12 %   Monocytes Absolute 0.6 0.1 - 1.0 K/uL   Eosinophils Relative 1 %   Eosinophils Absolute 0.1 0.0 - 0.5 K/uL   Basophils Relative 1 %   Basophils Absolute 0.0 0.0 - 0.1 K/uL   Immature Granulocytes 0 %   Abs Immature Granulocytes 0.02 0.00 - 0.07 K/uL  Basic metabolic panel  Result Value Ref Range   Sodium 139 135 - 145 mmol/L   Potassium 3.4 (L) 3.5 - 5.1 mmol/L   Chloride 105 98 - 111 mmol/L   CO2 26 22 - 32 mmol/L   Glucose, Bld 96 70 - 99 mg/dL   BUN 7 6 - 20 mg/dL   Creatinine, Ser 5.36 0.44 - 1.00 mg/dL   Calcium 9.1 8.9 -  10.3 mg/dL   GFR, Estimated >13 >08 mL/min   Anion gap 8 5 - 15  Hepatic function panel  Result Value Ref Range   Total Protein 6.9 6.5 - 8.1 g/dL   Albumin 3.7 3.5 - 5.0 g/dL   AST 22 15 - 41 U/L   ALT 25 0 - 44 U/L   Alkaline Phosphatase 52 38 - 126 U/L   Total Bilirubin 0.4 0.3 - 1.2 mg/dL   Bilirubin, Direct 0.1 0.0 - 0.2 mg/dL   Indirect Bilirubin 0.3 0.3 - 0.9 mg/dL  Lipase, blood  Result Value Ref Range   Lipase 40 11 - 51 U/L      Assessment & Plan:   Problem List Items Addressed This Visit   None    Follow up plan: No follow-ups on file.

## 2023-04-06 ENCOUNTER — Encounter: Payer: Self-pay | Admitting: Family Medicine

## 2023-04-29 ENCOUNTER — Other Ambulatory Visit: Payer: Self-pay

## 2023-04-29 ENCOUNTER — Other Ambulatory Visit: Payer: Self-pay | Admitting: Family Medicine

## 2023-04-29 DIAGNOSIS — E785 Hyperlipidemia, unspecified: Secondary | ICD-10-CM

## 2023-04-29 DIAGNOSIS — I700 Atherosclerosis of aorta: Secondary | ICD-10-CM

## 2023-04-29 MED ORDER — ATORVASTATIN CALCIUM 20 MG PO TABS
20.0000 mg | ORAL_TABLET | Freq: Every day | ORAL | 1 refills | Status: DC
Start: 2023-04-29 — End: 2023-06-15

## 2023-06-10 NOTE — Progress Notes (Signed)
Name: Cynthia Bean   MRN: [id number]    DOB: May 10, 1966   Date:06/15/2023       Progress Note  Subjective  Chief Complaint  Chief Complaint  Patient presents with   Follow-up    HPI  Discussed the use of AI scribe software for clinical note transcription with the patient, who gave verbal consent to proceed.  History of Present Illness   The patient, with a history of generalized anxiety disorder, major depression disorder, and pituitary adenoma, presents with increased anxiety and intermittent left upper quadrant pain. She reports a significant weight loss of over 50 pounds within the past year, attributed to a self-initiated Atkins diet. However, she also notes a recent bereavement, with her mother passing away at the end of August, after a prolonged period of caregiving spanning over 20 years.  The patient describes her anxiety as a feeling of tightness and pressure, which is alleviated by staying active and busy. She is currently on Lexapro and takes alprazolam as needed, primarily for sleep. She expresses a reluctance to increase medication usage, despite acknowledging a recent increase in anxiety levels.  chronic low back pain with radiculitis left lower leg: , it is intermittent and has been less frequent since her weight loss. The pain is described as sharp and shooting, occasionally radiating down the left leg. The patient manages this by frequently changing positions  She has noticed an  improvement of pain with weight loss.  The patient also reports a history of emphysema and has been using Trelegy a few times a week to manage shortness of breath. She quit smoking in 2018. She also has a history of low back pain, which has improved with weight loss.  She has been diagnosed with atherosclerosis of the aorta and is currently on atorvastatin. She also takes sublingual B12 and vitamin D supplements. She has a history of neuropathy, but has stopped taking pregabalin, instead managing  symptoms with compression sleeves.  The patient's recent bereavement, weight loss, and increased anxiety are current areas of concern. She is open to monitoring her condition and considering further investigations if symptoms persist or worsen.         Patient Active Problem List   Diagnosis Date Noted   Depression, major, in remission (HCC) 12/30/2022   Centrilobular emphysema (HCC) 07/28/2022   Atherosclerosis of aorta (HCC) 07/28/2022   History of colonic polyps    Polyp of descending colon    History of uterine fibroid 02/15/2019   Acquired trigger finger of right middle finger 04/27/2018   Trigger finger, right middle finger 04/19/2018   Dysfunction of both eustachian tubes 04/19/2018   Chronic left-sided lumbar radiculopathy 04/19/2018   ASCUS of cervix with negative high risk HPV 04/10/2017   Generalized anxiety disorder 09/20/2015   Allergic rhinitis 05/31/2015   Depression, major, recurrent, mild (HCC) 05/31/2015   Dyslipidemia 05/31/2015   Irritable bowel syndrome with diarrhea 05/31/2015   Female climacteric state 05/31/2015   Pituitary adenoma (HCC) 05/31/2015   Restless legs syndrome 05/31/2015   B12 deficiency 05/31/2015   Epicondylitis, lateral (tennis elbow) 05/31/2015   Tobacco use 05/03/2009   Vitamin D deficiency 05/03/2009    Past Surgical History:  Procedure Laterality Date   COLONOSCOPY WITH PROPOFOL N/A 05/31/2019   Procedure: COLONOSCOPY WITH PROPOFOL;  Surgeon: Midge Minium, MD;  Location: [facility] ENDOSCOPY;  Service: Endoscopy;  Laterality: N/A;   TONSILECTOMY, ADENOIDECTOMY, BILATERAL MYRINGOTOMY AND TUBES     TUBAL LIGATION     WISDOM TOOTH  EXTRACTION     patient stated that she now has a dry socket    Family History  Problem Relation Age of Onset   Hyperlipidemia Mother    Hypertension Mother    Thyroid disease Mother    Kidney disease Mother    Colon cancer Mother    Diabetes Father    Hypertension Father    CAD Father    Stroke Father     ADD / ADHD Son    Breast cancer Maternal Grandmother     Social History   Tobacco Use   Smoking status: Former    Current packs/day: 0.75    Average packs/day: 0.8 packs/day for 35.0 years (26.3 ttl pk-yrs)    Types: Cigarettes   Smokeless tobacco: Former    Quit date: 08/06/2016   Tobacco comments:    switched to e-cigarettes  Substance Use Topics   Alcohol use: Yes    Comment: occasional     Current Outpatient Medications:    ALPRAZolam (XANAX) 0.5 MG tablet, Take 1 tablet (0.5 mg total) by mouth daily as needed., Disp: 45 tablet, Rfl: 0   atorvastatin (LIPITOR) 20 MG tablet, Take 1 tablet (20 mg total) by mouth daily., Disp: 90 tablet, Rfl: 1   Cholecalciferol (VITAMIN D) 50 MCG (2000 UT) CAPS, Take 1 capsule (2,000 Units total) by mouth daily., Disp: 30 capsule, Rfl: 0   Cyanocobalamin (B-12) 1000 MCG SUBL, Place 1 tablet under the tongue every other day. , Disp: , Rfl:    escitalopram (LEXAPRO) 20 MG tablet, Take 1 tablet (20 mg total) by mouth daily., Disp: 90 tablet, Rfl: 1   fluticasone (FLONASE) 50 MCG/ACT nasal spray, SPRAY 2 SPRAYS INTO EACH NOSTRIL EVERY DAY, Disp: 48 mL, Rfl: 1   Fluticasone-Umeclidin-Vilant (TRELEGY ELLIPTA) 100-62.5-25 MCG/ACT AEPB, Inhale 1 puff into the lungs daily., Disp: 3 each, Rfl: 1   loratadine (CLARITIN) 10 MG tablet, Take 1 tablet (10 mg total) by mouth daily., Disp: 90 tablet, Rfl: 1   Probiotic Product (PROBIOTIC-10 PO), Take by mouth., Disp: , Rfl:   Allergies  Allergen Reactions   Biaxin  [Clarithromycin] Other (See Comments)   Bupropion Hcl Diarrhea   Darvocet [Propoxyphene N-Acetaminophen] Nausea Only    I personally reviewed active problem list, medication list, allergies, family history with the patient/caregiver today.   ROS  Ten systems reviewed and is negative except as mentioned in HPI    Objective  Vitals:   06/15/23 1315  BP: 122/78  Pulse: 78  Resp: 18  Temp: 97.7 F (36.5 C)  SpO2: 97%  Weight: 142 lb  12.8 oz (64.8 kg)    Body mass index is 22.37 kg/m.  Physical Exam  Constitutional: Patient appears well-developed and well-nourished.  No distress.  HEENT: head atraumatic, normocephalic, pupils equal and reactive to light, neck supple Cardiovascular: Normal rate, regular rhythm and normal heart sounds.  No murmur heard. No BLE edema. Pulmonary/Chest: Effort normal and breath sounds normal. No respiratory distress. Abdominal: Soft.  There is no tenderness. Normal bowel sounds Psychiatric: Patient has a normal mood and affect. behavior is normal. Judgment and thought content normal.    PHQ2/9:    06/15/2023    1:18 PM 12/30/2022    1:26 PM 07/28/2022    1:15 PM 03/11/2022   10:36 AM 11/05/2021    1:19 PM  Depression screen PHQ 2/9  Decreased Interest 1 0 0 0 0  Down, Depressed, Hopeless 1 0 0 0 0  PHQ - 2 Score 2  0 0 0 0  Altered sleeping 0 0 0 0 0  Tired, decreased energy 1 0 0 0 0  Change in appetite 0 0 0 0 0  Feeling bad or failure about yourself  0 0 0 0 0  Trouble concentrating 1 2 0 0 0  Moving slowly or fidgety/restless 2 2 0 0 0  Suicidal thoughts 0 0 0 0 0  PHQ-9 Score 6 4 0 0 0  Difficult doing work/chores Somewhat difficult Not difficult at all  Not difficult at all     phq 9 is positive   Fall Risk:    06/15/2023    1:16 PM 12/30/2022    1:17 PM 07/28/2022    1:15 PM 03/11/2022   10:35 AM 11/05/2021    1:18 PM  Fall Risk   Falls in the past year? 0 0 0 0 0  Number falls in past yr: 0  0 0 0  Injury with Fall? 0  0 0 0  Risk for fall due to :  No Fall Risks No Fall Risks No Fall Risks No Fall Risks  Follow up  Falls prevention discussed Falls prevention discussed Falls prevention discussed;Education provided Falls prevention discussed     Functional Status Survey: Is the patient deaf or have difficulty hearing?: No Does the patient have difficulty seeing, even when wearing glasses/contacts?: No Does the patient have difficulty concentrating, remembering,  or making decisions?: Yes Does the patient have difficulty walking or climbing stairs?: No Does the patient have difficulty dressing or bathing?: No Does the patient have difficulty doing errands alone such as visiting a doctor's office or shopping?: No    Assessment & Plan  Assessment and Plan    Grief Recent loss of mother in August. Normal grieving process discussed. -Allow natural grieving process to continue.  Generalized Anxiety Disorder (GAD) and Major Depressive Disorder (MDD) Experiencing increased anxiety, possibly related to grief. Currently on Lexapro and Alprazolam 0.5mg  as needed. -Continue Lexapro and Alprazolam 0.5mg  as needed. -Consider Buspar or Hydroxyzine if anxiety worsens.  Restless Leg Syndrome Improved with compression sleeves. No longer taking Pregabalin. -Continue use of compression sleeves as needed.  Hyperlipidemia Patient has been taking Atorvastatin daily. Last cholesterol check was in January. -Check cholesterol levels. -Continue Atorvastatin.  Low Back Pain Chronic, intermittent low back pain, sometimes radiating down the left leg. Improved with weight loss. -Continue current management strategies.  Pituitary Adenoma Stable, with normal prolactin levels. -Monitor prolactin levels.  Atherosclerosis of the Aorta Patient has plaque buildup. Currently on Atorvastatin. -Continue Atorvastatin. -Start baby aspirin daily.  Emphysema Patient quit smoking in 2018. Currently using Trelegy a few times a week. -Consider daily use of Trelegy to manage symptoms.  Weight Loss/Malnutrition due to the amount of weight loss Significant weight loss since January 2023. Patient has been on Atkins diet. -Monitor weight. -Discussed again getting CT of abdomen or referral to GI due to significant weight loss and importance of ruling out cancer, she wants to hold off for now   Vitamin B12 and D Deficiency Levels have been normal with  supplementation. -Continue B12 and Vitamin D supplementation.  General Health Maintenance -Colonoscopy due at the end of 2025. -Lung cancer screening due in February 2024. -Continue high calcium diet. -Consider decreasing use of Alprazolam over time.

## 2023-06-15 ENCOUNTER — Encounter: Payer: Self-pay | Admitting: Family Medicine

## 2023-06-15 ENCOUNTER — Ambulatory Visit (INDEPENDENT_AMBULATORY_CARE_PROVIDER_SITE_OTHER): Admitting: Family Medicine

## 2023-06-15 VITALS — BP 122/78 | HR 78 | Temp 97.7°F | Resp 18 | Wt 142.8 lb

## 2023-06-15 DIAGNOSIS — J432 Centrilobular emphysema: Secondary | ICD-10-CM

## 2023-06-15 DIAGNOSIS — E559 Vitamin D deficiency, unspecified: Secondary | ICD-10-CM

## 2023-06-15 DIAGNOSIS — F339 Major depressive disorder, recurrent, unspecified: Secondary | ICD-10-CM

## 2023-06-15 DIAGNOSIS — E785 Hyperlipidemia, unspecified: Secondary | ICD-10-CM

## 2023-06-15 DIAGNOSIS — R1012 Left upper quadrant pain: Secondary | ICD-10-CM

## 2023-06-15 DIAGNOSIS — Z79899 Other long term (current) drug therapy: Secondary | ICD-10-CM

## 2023-06-15 DIAGNOSIS — F411 Generalized anxiety disorder: Secondary | ICD-10-CM

## 2023-06-15 DIAGNOSIS — I700 Atherosclerosis of aorta: Secondary | ICD-10-CM | POA: Diagnosis not present

## 2023-06-15 DIAGNOSIS — E441 Mild protein-calorie malnutrition: Secondary | ICD-10-CM

## 2023-06-15 DIAGNOSIS — E538 Deficiency of other specified B group vitamins: Secondary | ICD-10-CM

## 2023-06-15 DIAGNOSIS — D352 Benign neoplasm of pituitary gland: Secondary | ICD-10-CM

## 2023-06-15 MED ORDER — ALPRAZOLAM 0.5 MG PO TABS: 0.5000 mg | ORAL_TABLET | Freq: Every day | ORAL | 0 refills | Status: DC | PRN

## 2023-06-15 MED ORDER — ESCITALOPRAM OXALATE 20 MG PO TABS
20.0000 mg | ORAL_TABLET | Freq: Every day | ORAL | 1 refills | Status: DC
Start: 2023-06-15 — End: 2023-08-05

## 2023-06-15 MED ORDER — ATORVASTATIN CALCIUM 20 MG PO TABS
20.0000 mg | ORAL_TABLET | Freq: Every day | ORAL | 1 refills | Status: DC
Start: 2023-06-15 — End: 2023-12-14

## 2023-06-16 ENCOUNTER — Encounter: Payer: Self-pay | Admitting: Family Medicine

## 2023-06-16 ENCOUNTER — Other Ambulatory Visit: Payer: Self-pay | Admitting: Family Medicine

## 2023-06-20 LAB — CBC WITH DIFFERENTIAL/PLATELET
Basophils Absolute: 0.1 10*3/uL (ref 0.0–0.2)
Basos: 1 %
EOS (ABSOLUTE): 0.1 10*3/uL (ref 0.0–0.4)
Eos: 2 %
Hematocrit: 43.6 % (ref 34.0–46.6)
Hemoglobin: 14.1 g/dL (ref 11.1–15.9)
Immature Grans (Abs): 0 10*3/uL (ref 0.0–0.1)
Immature Granulocytes: 0 %
Lymphocytes Absolute: 1.6 10*3/uL (ref 0.7–3.1)
Lymphs: 35 %
MCH: 32.3 pg (ref 26.6–33.0)
MCHC: 32.3 g/dL (ref 31.5–35.7)
MCV: 100 fL — ABNORMAL HIGH (ref 79–97)
Monocytes Absolute: 0.5 10*3/uL (ref 0.1–0.9)
Monocytes: 10 %
Neutrophils Absolute: 2.4 10*3/uL (ref 1.4–7.0)
Neutrophils: 52 %
Platelets: 246 10*3/uL (ref 150–450)
RBC: 4.37 x10E6/uL (ref 3.77–5.28)
RDW: 11.6 % — ABNORMAL LOW (ref 11.7–15.4)
WBC: 4.6 10*3/uL (ref 3.4–10.8)

## 2023-06-20 LAB — COMPREHENSIVE METABOLIC PANEL
ALT: 16 [IU]/L (ref 0–32)
AST: 16 [IU]/L (ref 0–40)
Albumin: 4.6 g/dL (ref 3.8–4.9)
Alkaline Phosphatase: 69 [IU]/L (ref 44–121)
BUN/Creatinine Ratio: 16 (ref 9–23)
BUN: 17 mg/dL (ref 6–24)
Bilirubin Total: 0.4 mg/dL (ref 0.0–1.2)
CO2: 26 mmol/L (ref 20–29)
Calcium: 10.1 mg/dL (ref 8.7–10.2)
Chloride: 105 mmol/L (ref 96–106)
Creatinine, Ser: 1.07 mg/dL — ABNORMAL HIGH (ref 0.57–1.00)
Globulin, Total: 2 g/dL (ref 1.5–4.5)
Glucose: 87 mg/dL (ref 70–99)
Potassium: 4.6 mmol/L (ref 3.5–5.2)
Sodium: 141 mmol/L (ref 134–144)
Total Protein: 6.6 g/dL (ref 6.0–8.5)
eGFR: 60 mL/min/{1.73_m2} (ref >59)

## 2023-06-20 LAB — LIPID PANEL
Chol/HDL Ratio: 2.6 {ratio} (ref 0.0–4.4)
Cholesterol, Total: 172 mg/dL (ref 100–199)
HDL: 67 mg/dL (ref >39)
LDL Chol Calc (NIH): 95 mg/dL (ref 0–99)
Triglycerides: 52 mg/dL (ref 0–149)
VLDL Cholesterol Cal: 10 mg/dL (ref 5–40)

## 2023-06-20 LAB — B12 AND FOLATE PANEL
Folate: 13.6 ng/mL (ref >3.0)
Vitamin B-12: 792 pg/mL (ref 232–1245)

## 2023-06-20 LAB — PROLACTIN: Prolactin: 8.9 ng/mL (ref 3.6–25.2)

## 2023-06-20 LAB — VITAMIN D 25 HYDROXY (VIT D DEFICIENCY, FRACTURES): Vit D, 25-Hydroxy: 68 ng/mL (ref 30.0–100.0)

## 2023-06-22 ENCOUNTER — Encounter: Payer: Self-pay | Admitting: Family Medicine

## 2023-07-02 ENCOUNTER — Other Ambulatory Visit: Payer: Self-pay | Admitting: Acute Care

## 2023-07-02 DIAGNOSIS — Z87891 Personal history of nicotine dependence: Secondary | ICD-10-CM

## 2023-07-02 DIAGNOSIS — Z122 Encounter for screening for malignant neoplasm of respiratory organs: Secondary | ICD-10-CM

## 2023-07-03 ENCOUNTER — Encounter: Payer: Self-pay | Admitting: Family Medicine

## 2023-08-05 ENCOUNTER — Encounter: Payer: Self-pay | Admitting: Family Medicine

## 2023-08-05 ENCOUNTER — Other Ambulatory Visit: Payer: Self-pay | Admitting: Family Medicine

## 2023-08-05 DIAGNOSIS — F339 Major depressive disorder, recurrent, unspecified: Secondary | ICD-10-CM

## 2023-08-05 DIAGNOSIS — F411 Generalized anxiety disorder: Secondary | ICD-10-CM

## 2023-08-05 MED ORDER — ESCITALOPRAM OXALATE 10 MG PO TABS
20.0000 mg | ORAL_TABLET | Freq: Every day | ORAL | 0 refills | Status: DC
Start: 2023-08-05 — End: 2023-12-14

## 2023-08-21 ENCOUNTER — Ambulatory Visit
Admission: RE | Admit: 2023-08-21 | Discharge: 2023-08-21 | Disposition: A | Discharge status: Home / Self Care | Source: Ambulatory Visit | Attending: Acute Care | Admitting: Acute Care

## 2023-08-21 DIAGNOSIS — Z122 Encounter for screening for malignant neoplasm of respiratory organs: Secondary | ICD-10-CM | POA: Insufficient documentation

## 2023-08-21 DIAGNOSIS — Z87891 Personal history of nicotine dependence: Secondary | ICD-10-CM | POA: Insufficient documentation

## 2023-09-04 ENCOUNTER — Encounter: Payer: Self-pay | Admitting: Family Medicine

## 2023-09-07 ENCOUNTER — Other Ambulatory Visit: Payer: Self-pay

## 2023-09-07 ENCOUNTER — Other Ambulatory Visit: Payer: Self-pay | Admitting: Family Medicine

## 2023-09-07 DIAGNOSIS — F17210 Nicotine dependence, cigarettes, uncomplicated: Secondary | ICD-10-CM

## 2023-09-07 DIAGNOSIS — R634 Abnormal weight loss: Secondary | ICD-10-CM

## 2023-09-07 DIAGNOSIS — Z87891 Personal history of nicotine dependence: Secondary | ICD-10-CM

## 2023-09-07 DIAGNOSIS — Z122 Encounter for screening for malignant neoplasm of respiratory organs: Secondary | ICD-10-CM

## 2023-09-07 DIAGNOSIS — R1012 Left upper quadrant pain: Secondary | ICD-10-CM

## 2023-09-14 ENCOUNTER — Ambulatory Visit
Admission: RE | Admit: 2023-09-14 | Discharge: 2023-09-14 | Disposition: A | Discharge status: Home / Self Care | Source: Ambulatory Visit | Attending: Family Medicine | Admitting: Family Medicine

## 2023-09-14 DIAGNOSIS — R1012 Left upper quadrant pain: Secondary | ICD-10-CM | POA: Diagnosis present

## 2023-09-14 DIAGNOSIS — R634 Abnormal weight loss: Secondary | ICD-10-CM | POA: Diagnosis present

## 2023-09-14 MED ORDER — IOHEXOL 300 MG/ML  SOLN
85.0000 mL | Freq: Once | INTRAMUSCULAR | Status: AC | PRN
  Administered 2023-09-14: 85 mL via INTRAVENOUS

## 2023-09-21 ENCOUNTER — Encounter: Payer: Self-pay | Admitting: Family Medicine

## 2023-09-21 ENCOUNTER — Other Ambulatory Visit: Payer: Self-pay | Admitting: Family Medicine

## 2023-09-21 DIAGNOSIS — R935 Abnormal findings on diagnostic imaging of other abdominal regions, including retroperitoneum: Secondary | ICD-10-CM

## 2023-09-21 DIAGNOSIS — E785 Hyperlipidemia, unspecified: Secondary | ICD-10-CM

## 2023-09-21 DIAGNOSIS — I700 Atherosclerosis of aorta: Secondary | ICD-10-CM

## 2023-09-30 ENCOUNTER — Encounter: Payer: Self-pay | Admitting: Family Medicine

## 2023-09-30 ENCOUNTER — Ambulatory Visit
Admission: RE | Admit: 2023-09-30 | Discharge: 2023-09-30 | Disposition: A | Discharge status: Home / Self Care | Payer: Self-pay | Source: Ambulatory Visit | Attending: Family Medicine | Admitting: Family Medicine

## 2023-09-30 ENCOUNTER — Ambulatory Visit
Admission: RE | Admit: 2023-09-30 | Discharge: 2023-09-30 | Disposition: A | Discharge status: Home / Self Care | Source: Ambulatory Visit | Attending: Family Medicine | Admitting: Family Medicine

## 2023-09-30 DIAGNOSIS — I700 Atherosclerosis of aorta: Secondary | ICD-10-CM | POA: Insufficient documentation

## 2023-09-30 DIAGNOSIS — R935 Abnormal findings on diagnostic imaging of other abdominal regions, including retroperitoneum: Secondary | ICD-10-CM | POA: Insufficient documentation

## 2023-09-30 DIAGNOSIS — E785 Hyperlipidemia, unspecified: Secondary | ICD-10-CM | POA: Insufficient documentation

## 2023-11-02 ENCOUNTER — Other Ambulatory Visit: Payer: Self-pay | Admitting: Family Medicine

## 2023-11-02 DIAGNOSIS — Z1231 Encounter for screening mammogram for malignant neoplasm of breast: Secondary | ICD-10-CM

## 2023-11-03 ENCOUNTER — Telehealth: Admitting: Physician Assistant

## 2023-11-03 DIAGNOSIS — B9689 Other specified bacterial agents as the cause of diseases classified elsewhere: Secondary | ICD-10-CM

## 2023-11-03 DIAGNOSIS — J019 Acute sinusitis, unspecified: Secondary | ICD-10-CM | POA: Diagnosis not present

## 2023-11-03 MED ORDER — PREDNISONE 10 MG (21) PO TBPK
ORAL_TABLET | ORAL | 0 refills | Status: DC
Start: 2023-11-03 — End: 2023-12-14

## 2023-11-03 MED ORDER — AMOXICILLIN-POT CLAVULANATE 875-125 MG PO TABS
1.0000 | ORAL_TABLET | Freq: Two times a day (BID) | ORAL | 0 refills | Status: DC
Start: 2023-11-03 — End: 2023-12-14

## 2023-11-03 NOTE — Progress Notes (Signed)
 E-Visit for Sinus Problems  We are sorry that you are not feeling well.  Here is how we plan to help!  Based on what you have shared with me it looks like you have sinusitis.  Sinusitis is inflammation and infection in the sinus cavities of the head.  Based on your presentation I believe you most likely have Acute Bacterial Sinusitis.  This is an infection caused by bacteria and is treated with antibiotics. I have prescribed Doxycycline  100mg  by mouth twice a day for 10 days. I have also prescribed a steroid pack to use as directed. You may use an oral decongestant such as Mucinex D or if you have glaucoma or high blood pressure use plain Mucinex. Saline nasal spray help and can safely be used as often as needed for congestion.  If you develop worsening sinus pain, fever or notice severe headache and vision changes, or if symptoms are not better after completion of antibiotic, please schedule an appointment with a health care provider.    Sinus infections are not as easily transmitted as other respiratory infection, however we still recommend that you avoid close contact with loved ones, especially the very young and elderly.  Remember to wash your hands thoroughly throughout the day as this is the number one way to prevent the spread of infection!  Home Care: Only take medications as instructed by your medical team. Complete the entire course of an antibiotic. Do not take these medications with alcohol. A steam or ultrasonic humidifier can help congestion.  You can place a towel over your head and breathe in the steam from hot water coming from a faucet. Avoid close contacts especially the very young and the elderly. Cover your mouth when you cough or sneeze. Always remember to wash your hands.  Get Help Right Away If: You develop worsening fever or sinus pain. You develop a severe head ache or visual changes. Your symptoms persist after you have completed your treatment plan.  Make sure  you Understand these instructions. Will watch your condition. Will get help right away if you are not doing well or get worse.  Thank you for choosing an e-visit.  Your e-visit answers were reviewed by a board certified advanced clinical practitioner to complete your personal care plan. Depending upon the condition, your plan could have included both over the counter or prescription medications.  Please review your pharmacy choice. Make sure the pharmacy is open so you can pick up prescription now. If there is a problem, you may contact your provider through [employer] and have the prescription routed to another pharmacy.  Your safety is important to us . If you have drug allergies check your prescription carefully.   For the next 24 hours you can use MyChart to ask questions about today's visit, request a non-urgent call back, or ask for a work or school excuse. You will get an email in the next two days asking about your experience. I hope that your e-visit has been valuable and will speed your recovery.

## 2023-11-03 NOTE — Progress Notes (Signed)
 I have spent 5 minutes in review of e-visit questionnaire, review and updating patient chart, medical decision making and response to patient.   Piedad Climes, PA-C

## 2023-12-14 ENCOUNTER — Encounter: Payer: Self-pay | Admitting: Family Medicine

## 2023-12-14 ENCOUNTER — Ambulatory Visit: Admitting: Family Medicine

## 2023-12-14 VITALS — BP 104/70 | HR 77 | Resp 16 | Ht 67.0 in | Wt 147.9 lb

## 2023-12-14 DIAGNOSIS — E559 Vitamin D deficiency, unspecified: Secondary | ICD-10-CM

## 2023-12-14 DIAGNOSIS — I700 Atherosclerosis of aorta: Secondary | ICD-10-CM

## 2023-12-14 DIAGNOSIS — R8290 Unspecified abnormal findings in urine: Secondary | ICD-10-CM

## 2023-12-14 DIAGNOSIS — F411 Generalized anxiety disorder: Secondary | ICD-10-CM

## 2023-12-14 DIAGNOSIS — D352 Benign neoplasm of pituitary gland: Secondary | ICD-10-CM

## 2023-12-14 DIAGNOSIS — J432 Centrilobular emphysema: Secondary | ICD-10-CM | POA: Diagnosis not present

## 2023-12-14 DIAGNOSIS — M5416 Radiculopathy, lumbar region: Secondary | ICD-10-CM

## 2023-12-14 DIAGNOSIS — I2510 Atherosclerotic heart disease of native coronary artery without angina pectoris: Secondary | ICD-10-CM

## 2023-12-14 DIAGNOSIS — R350 Frequency of micturition: Secondary | ICD-10-CM

## 2023-12-14 DIAGNOSIS — J3089 Other allergic rhinitis: Secondary | ICD-10-CM

## 2023-12-14 DIAGNOSIS — J302 Other seasonal allergic rhinitis: Secondary | ICD-10-CM

## 2023-12-14 DIAGNOSIS — N9489 Other specified conditions associated with female genital organs and menstrual cycle: Secondary | ICD-10-CM

## 2023-12-14 DIAGNOSIS — F325 Major depressive disorder, single episode, in full remission: Secondary | ICD-10-CM

## 2023-12-14 DIAGNOSIS — E538 Deficiency of other specified B group vitamins: Secondary | ICD-10-CM

## 2023-12-14 MED ORDER — ATORVASTATIN CALCIUM 20 MG PO TABS: 20.0000 mg | ORAL_TABLET | Freq: Every day | ORAL | 1 refills | Status: DC

## 2023-12-14 MED ORDER — ALPRAZOLAM 0.5 MG PO TABS: 0.5000 mg | ORAL_TABLET | Freq: Every day | ORAL | 0 refills | Status: DC | PRN

## 2023-12-14 MED ORDER — ESCITALOPRAM OXALATE 20 MG PO TABS: 20.0000 mg | ORAL_TABLET | Freq: Every day | ORAL | 1 refills | Status: DC

## 2023-12-14 MED ORDER — FLUTICASONE PROPIONATE 50 MCG/ACT NA SUSP: 2.0000 | Freq: Every day | NASAL | 1 refills | Status: DC

## 2023-12-14 MED ORDER — TRELEGY ELLIPTA 100-62.5-25 MCG/ACT IN AEPB: 1.0000 | INHALATION_SPRAY | Freq: Every day | RESPIRATORY_TRACT | 1 refills | Status: DC

## 2023-12-14 NOTE — Progress Notes (Signed)
 Name: Cynthia Bean   MRN: [id number]    DOB: [birth year]   Date:12/14/2023       Progress Note  Subjective  Chief Complaint  Chief Complaint  Patient presents with   Medical Management of Chronic Issues   Discussed the use of AI scribe software for clinical note transcription with the patient, who gave verbal consent to proceed.  History of Present Illness  She is here today for her 6 month follow up.   Last visit she had been experience chest tightness and pressure associated with anxiety and bereavement after mother's death. Due to family history of heart disease we checked a CT calcium  score that was positive and advised to increase statin therapy, she also takes aspirin. She states chest tightness resolved.She has episodes of chest pain described as sharp and only lasts a few seconds. She also has SOB with activity and atherosclerosis of aorta. Explained that she may want to see cardiologist, she wants to start using Trelegy daily first to see if symptoms of SOB with activity improves first  Centrilobular emphysema: only using Trelegy prn, denies cough or wheezing but has SOB with activity. She quit smoking in 2018 but is still vaping  Insomnia: she takes a quarter of alprazolam  at night, it works well for her, medication lasts 90 days, she gest 45 pills during her office visits   Urinary frequency and odor: on and off for months. Pelvic US  showed congestion, we will check urine culture and urinalysis but also needs to follow up with gyn    chronic low back pain with radiculitis left lower leg: , it is intermittent and has been less frequent since her weight loss. The pain is described as sharp and shooting, occasionally radiating down the left leg. The patient manages this by frequently changing positions  She avoids riding for a long time and is now having left lower leg cramping at night     Patient Active Problem List   Diagnosis Date Noted   Depression, major, in remission (HCC)  12/30/2022   Centrilobular emphysema (HCC) 07/28/2022   Atherosclerosis of aorta (HCC) 07/28/2022   History of colonic polyps    Polyp of descending colon    History of uterine fibroid 02/15/2019   Acquired trigger finger of right middle finger 04/27/2018   Trigger finger, right middle finger 04/19/2018   Dysfunction of both eustachian tubes 04/19/2018   Chronic left-sided lumbar radiculopathy 04/19/2018   ASCUS of cervix with negative high risk HPV 04/10/2017   Generalized anxiety disorder 09/20/2015   Allergic rhinitis 05/31/2015   Depression, major, recurrent, mild (HCC) 05/31/2015   Dyslipidemia 05/31/2015   Irritable bowel syndrome with diarrhea 05/31/2015   Female climacteric state 05/31/2015   Pituitary adenoma (HCC) 05/31/2015   Restless legs syndrome 05/31/2015   B12 deficiency 05/31/2015   Epicondylitis, lateral (tennis elbow) 05/31/2015   Tobacco use 05/03/2009   Vitamin D  deficiency 05/03/2009    Past Surgical History:  Procedure Laterality Date   COLONOSCOPY WITH PROPOFOL  N/A 05/31/2019   Procedure: COLONOSCOPY WITH PROPOFOL ;  Surgeon: Marnee Sink, MD;  Location: [facility] ENDOSCOPY;  Service: Endoscopy;  Laterality: N/A;   TONSILECTOMY, ADENOIDECTOMY, BILATERAL MYRINGOTOMY AND TUBES     TUBAL LIGATION     WISDOM TOOTH EXTRACTION     patient stated that she now has a dry socket    Family History  Problem Relation Age of Onset   Hyperlipidemia Mother    Hypertension Mother    Thyroid disease Mother  Kidney disease Mother    Colon cancer Mother    Diabetes Father    Hypertension Father    CAD Father    Stroke Father    Breast cancer Maternal Grandmother    ADD / ADHD Son     Social History   Tobacco Use   Smoking status: Former    Current packs/day: 0.75    Average packs/day: 0.8 packs/day for 35.0 years (26.3 ttl pk-yrs)    Types: Cigarettes   Smokeless tobacco: Former    Quit date: 08/06/2016   Tobacco comments:    switched to e-cigarettes   Substance Use Topics   Alcohol use: Yes    Comment: occasional     Current Outpatient Medications:    ALPRAZolam  (XANAX ) 0.5 MG tablet, Take 1 tablet (0.5 mg total) by mouth daily as needed., Disp: 45 tablet, Rfl: 0   atorvastatin  (LIPITOR) 20 MG tablet, Take 1 tablet (20 mg total) by mouth daily., Disp: 90 tablet, Rfl: 1   Cholecalciferol (VITAMIN D ) 50 MCG (2000 UT) CAPS, Take 1 capsule (2,000 Units total) by mouth daily., Disp: 30 capsule, Rfl: 0   Cyanocobalamin  (B-12) 1000 MCG SUBL, Place 1 tablet under the tongue every other day. , Disp: , Rfl:    fluticasone  (FLONASE ) 50 MCG/ACT nasal spray, SPRAY 2 SPRAYS INTO EACH NOSTRIL EVERY DAY, Disp: 48 mL, Rfl: 1   Fluticasone -Umeclidin-Vilant (TRELEGY ELLIPTA ) 100-62.5-25 MCG/ACT AEPB, Inhale 1 puff into the lungs daily., Disp: 3 each, Rfl: 1   loratadine  (CLARITIN ) 10 MG tablet, Take 1 tablet (10 mg total) by mouth daily., Disp: 90 tablet, Rfl: 1   Probiotic Product (PROBIOTIC-10 PO), Take by mouth., Disp: , Rfl:    amoxicillin -clavulanate (AUGMENTIN ) 875-125 MG tablet, Take 1 tablet by mouth 2 (two) times daily., Disp: 14 tablet, Rfl: 0   escitalopram  (LEXAPRO ) 10 MG tablet, Take 2 tablets (20 mg total) by mouth daily., Disp: 180 tablet, Rfl: 0   predniSONE  (STERAPRED UNI-PAK 21 TAB) 10 MG (21) TBPK tablet, Take following package directions, Disp: 21 tablet, Rfl: 0  Allergies  Allergen Reactions   Biaxin  [Clarithromycin] Other (See Comments)   Bupropion Hcl Diarrhea   Darvocet [Propoxyphene N-Acetaminophen] Nausea Only    I personally reviewed active problem list, medication list, allergies, family history with the patient/caregiver today.   ROS  Ten systems reviewed and is negative except as mentioned in HPI    Objective Physical Exam Constitutional: Patient appears well-developed and well-nourished.  No distress.  HEENT: head atraumatic, normocephalic, pupils equal and reactive to light, neck supple Cardiovascular: Normal  rate, regular rhythm and normal heart sounds.  No murmur heard. No BLE edema. Pulmonary/Chest: Effort normal and breath sounds normal. No respiratory distress. Abdominal: Soft.  There is no tenderness. Muscular skeletal: tender Left lower back, negative straight leg raise Psychiatric: Patient has a normal mood and affect. behavior is normal. Judgment and thought content normal.   Vitals:   12/14/23 1308  BP: 104/70  Pulse: 77  Resp: 16  SpO2: 95%  Weight: 147 lb 14.4 oz (67.1 kg)  Height: 5\' 7"  (1.702 m)    Body mass index is 23.16 kg/m.   PHQ2/9:    12/14/2023    1:04 PM 06/15/2023    1:18 PM 12/30/2022    1:26 PM 07/28/2022    1:15 PM 03/11/2022   10:36 AM  Depression screen PHQ 2/9  Decreased Interest 0 1 0 0 0  Down, Depressed, Hopeless 0 1 0 0 0  PHQ -  2 Score 0 2 0 0 0  Altered sleeping 0 0 0 0 0  Tired, decreased energy 0 1 0 0 0  Change in appetite 0 0 0 0 0  Feeling bad or failure about yourself  0 0 0 0 0  Trouble concentrating 0 1 2 0 0  Moving slowly or fidgety/restless 0 2 2 0 0  Suicidal thoughts 0 0 0 0 0  PHQ-9 Score 0 6 4 0 0  Difficult doing work/chores Not difficult at all Somewhat difficult Not difficult at all  Not difficult at all    phq 9 is negative  Fall Risk:    12/14/2023   12:58 PM 06/15/2023    1:16 PM 12/30/2022    1:17 PM 07/28/2022    1:15 PM 03/11/2022   10:35 AM  Fall Risk   Falls in the past year? 0 0 0 0 0  Number falls in past yr: 0 0  0 0  Injury with Fall? 0 0  0 0  Risk for fall due to : No Fall Risks  No Fall Risks No Fall Risks No Fall Risks  Follow up Falls prevention discussed;Education provided;Falls evaluation completed  Falls prevention discussed Falls prevention discussed Falls prevention discussed;Education provided      Assessment & Plan 1. Major depression in remission (HCC) (Primary)  - escitalopram  (LEXAPRO ) 20 MG tablet; Take 1 tablet (20 mg total) by mouth daily.  Dispense: 90 tablet; Refill: 1  2.  Atherosclerosis of aorta (HCC)  - atorvastatin  (LIPITOR) 20 MG tablet; Take 1 tablet (20 mg total) by mouth daily.  Dispense: 90 tablet; Refill: 1 Last LDL above 70 but she prefers continuing 20 mg dose until next visit when we recheck labs   3. Pituitary adenoma (HCC)  Last MRI stable but back in 2017, discussed rechecking it but she would like to hold off for now   4. Centrilobular emphysema (HCC)  - Fluticasone -Umeclidin-Vilant (TRELEGY ELLIPTA ) 100-62.5-25 MCG/ACT AEPB; Inhale 1 puff into the lungs daily.  Dispense: 3 each; Refill: 1  5. Vitamin D  deficiency  Resume Vitamin D  three times a week   6. Urinary frequency  - Urinalysis, Complete - CULTURE, URINE COMPREHENSIVE  7. Female pelvic congestion syndrome  Needs to follow up with gyn   8. Bad odor of urine  - Urinalysis, Complete - CULTURE, URINE COMPREHENSIVE  9. B12 deficiency  Continue supplementation   10. Generalized anxiety disorder  - escitalopram  (LEXAPRO ) 20 MG tablet; Take 1 tablet (20 mg total) by mouth daily.  Dispense: 90 tablet; Refill: 1 - ALPRAZolam  (XANAX ) 0.5 MG tablet; Take 1 tablet (0.5 mg total) by mouth daily as needed.  Dispense: 45 tablet; Refill: 0  11. Perennial allergic rhinitis with seasonal variation  - fluticasone  (FLONASE ) 50 MCG/ACT nasal spray; Place 2 sprays into both nostrils daily.  Dispense: 48 mL; Refill: 1  12. Coronary artery disease involving native coronary artery of native heart without angina pectoris  Needs LDL below 70  13. Lumbar back pain with radiculopathy affecting left lower extremity  She has medrol dose pack at home but hesitant to take it, pain affects ability to go for long rides in the car, discussed PT versus chiropractor care

## 2023-12-16 ENCOUNTER — Ambulatory Visit: Payer: Self-pay | Admitting: Family Medicine

## 2023-12-16 LAB — URINALYSIS, COMPLETE
Bacteria, UA: NONE SEEN /HPF
Bilirubin Urine: NEGATIVE
Glucose, UA: NEGATIVE
Hgb urine dipstick: NEGATIVE
Hyaline Cast: NONE SEEN /LPF
Ketones, ur: NEGATIVE
Leukocytes,Ua: NEGATIVE
Nitrite: NEGATIVE
Protein, ur: NEGATIVE
RBC / HPF: NONE SEEN /HPF (ref 0–2)
Specific Gravity, Urine: 1.014 (ref 1.001–1.035)
Squamous Epithelial / HPF: NONE SEEN /HPF (ref <=5)
WBC, UA: NONE SEEN /HPF (ref 0–5)
pH: 5.5 (ref 5.0–8.0)

## 2023-12-16 LAB — CULTURE, URINE COMPREHENSIVE
MICRO NUMBER:: 16527152
SPECIMEN QUALITY:: ADEQUATE

## 2023-12-17 ENCOUNTER — Encounter

## 2024-01-01 ENCOUNTER — Telehealth: Payer: Self-pay

## 2024-01-01 NOTE — Telephone Encounter (Signed)
 The patient called in to schedule her repeat colonoscopy.

## 2024-01-01 NOTE — Telephone Encounter (Signed)
 Tried to call patient but could not leave message.   Need to inform patient that we do not have the schedule available for November yet. Will send a reminder in October.

## 2024-01-01 NOTE — Telephone Encounter (Signed)
 Patient called back. Explain to patient about the schedule.

## 2024-01-05 ENCOUNTER — Ambulatory Visit
Admission: RE | Admit: 2024-01-05 | Discharge: 2024-01-05 | Disposition: A | Discharge status: Home / Self Care | Source: Ambulatory Visit | Attending: Family Medicine | Admitting: Family Medicine

## 2024-01-05 DIAGNOSIS — Z1231 Encounter for screening mammogram for malignant neoplasm of breast: Secondary | ICD-10-CM | POA: Diagnosis present

## 2024-05-24 ENCOUNTER — Telehealth: Payer: Self-pay

## 2024-05-24 DIAGNOSIS — Z860100 Personal history of colon polyps, unspecified: Secondary | ICD-10-CM

## 2024-05-24 DIAGNOSIS — Z800 Family history of malignant neoplasm of digestive organs: Secondary | ICD-10-CM

## 2024-05-24 NOTE — Telephone Encounter (Signed)
 Returned phone call to patient to schedule her 5 year recall colonoscopy.  LVM for pt to return my call.   Thanks,  Carter, CMA

## 2024-05-25 ENCOUNTER — Other Ambulatory Visit: Payer: Self-pay

## 2024-05-25 DIAGNOSIS — Z860100 Personal history of colon polyps, unspecified: Secondary | ICD-10-CM

## 2024-05-25 DIAGNOSIS — Z800 Family history of malignant neoplasm of digestive organs: Secondary | ICD-10-CM

## 2024-05-25 MED ORDER — NA SULFATE-K SULFATE-MG SULF 17.5-3.13-1.6 GM/177ML PO SOLN: 1.0000 | Freq: Once | ORAL | 0 refills | Status: AC

## 2024-05-25 NOTE — Telephone Encounter (Signed)
 Gastroenterology Pre-Procedure Review  Request Date: 08/23/24 Requesting Physician: Dr. Jinny  PATIENT REVIEW QUESTIONS: The patient responded to the following health history questions as indicated:    1. Are you having any GI issues? no 2. Do you have a personal history of Polyps? yes (Last colonoscopy performed by Dr. Jinny 06/01/19 recommended repeat in 5 years) 3. Do you have a family history of Colon Cancer or Polyps? yes (mother colon cancer) 4. Diabetes Mellitus? no 5. Joint replacements in the past 12 months?no 6. Major health problems in the past 3 months?no 7. Any artificial heart valves, MVP, or defibrillator?no    MEDICATIONS & ALLERGIES:    Patient reports the following regarding taking any anticoagulation/antiplatelet therapy:   Plavix, Coumadin, Eliquis, Xarelto, Lovenox, Pradaxa, Brilinta, or Effient? no Aspirin? yes (81 mg daily)  Patient confirms/reports the following medications:  Current Outpatient Medications  Medication Sig Dispense Refill   ALPRAZolam  (XANAX ) 0.5 MG tablet Take 1 tablet (0.5 mg total) by mouth daily as needed. 45 tablet 0   atorvastatin  (LIPITOR) 20 MG tablet Take 1 tablet (20 mg total) by mouth daily. 90 tablet 1   Cholecalciferol (VITAMIN D ) 50 MCG (2000 UT) CAPS Take 1 capsule (2,000 Units total) by mouth daily. 30 capsule 0   co-enzyme Q-10 30 MG capsule Take 30 mg by mouth 3 (three) times daily.     Cyanocobalamin  (B-12) 1000 MCG SUBL Place 1 tablet under the tongue every other day.      escitalopram  (LEXAPRO ) 20 MG tablet Take 1 tablet (20 mg total) by mouth daily. 90 tablet 1   fluticasone  (FLONASE ) 50 MCG/ACT nasal spray Place 2 sprays into both nostrils daily. 48 mL 1   Fluticasone -Umeclidin-Vilant (TRELEGY ELLIPTA ) 100-62.5-25 MCG/ACT AEPB Inhale 1 puff into the lungs daily. 3 each 1   Probiotic Product (PROBIOTIC-10 PO) Take by mouth.     No current facility-administered medications for this visit.    Patient confirms/reports the  following allergies:  Allergies  Allergen Reactions   Biaxin  [Clarithromycin] Other (See Comments)   Bupropion Hcl Diarrhea   Darvocet [Propoxyphene N-Acetaminophen] Nausea Only    No orders of the defined types were placed in this encounter.   AUTHORIZATION INFORMATION Primary Insurance: 1D#: Group #:  Secondary Insurance: 1D#: Group #:  SCHEDULE INFORMATION: Date: 08/23/24 Time: Location: [facility]

## 2024-05-25 NOTE — Addendum Note (Signed)
 Addended by: CURTISS ROSALINE RAMAN on: 05/25/2024 08:51 AM   Modules accepted: Orders

## 2024-06-14 ENCOUNTER — Ambulatory Visit: Attending: Family Medicine

## 2024-06-14 ENCOUNTER — Ambulatory Visit: Admitting: Family Medicine

## 2024-06-14 ENCOUNTER — Encounter: Payer: Self-pay | Admitting: Family Medicine

## 2024-06-14 VITALS — BP 116/68 | HR 93 | Temp 97.9°F | Resp 18 | Ht 67.0 in | Wt 144.2 lb

## 2024-06-14 DIAGNOSIS — R000 Tachycardia, unspecified: Secondary | ICD-10-CM

## 2024-06-14 DIAGNOSIS — J432 Centrilobular emphysema: Secondary | ICD-10-CM

## 2024-06-14 DIAGNOSIS — D352 Benign neoplasm of pituitary gland: Secondary | ICD-10-CM

## 2024-06-14 DIAGNOSIS — Z79899 Other long term (current) drug therapy: Secondary | ICD-10-CM

## 2024-06-14 DIAGNOSIS — J302 Other seasonal allergic rhinitis: Secondary | ICD-10-CM

## 2024-06-14 DIAGNOSIS — E538 Deficiency of other specified B group vitamins: Secondary | ICD-10-CM

## 2024-06-14 DIAGNOSIS — I700 Atherosclerosis of aorta: Secondary | ICD-10-CM

## 2024-06-14 DIAGNOSIS — I2510 Atherosclerotic heart disease of native coronary artery without angina pectoris: Secondary | ICD-10-CM

## 2024-06-14 DIAGNOSIS — E559 Vitamin D deficiency, unspecified: Secondary | ICD-10-CM

## 2024-06-14 DIAGNOSIS — Z131 Encounter for screening for diabetes mellitus: Secondary | ICD-10-CM

## 2024-06-14 DIAGNOSIS — Z23 Encounter for immunization: Secondary | ICD-10-CM

## 2024-06-14 DIAGNOSIS — F411 Generalized anxiety disorder: Secondary | ICD-10-CM

## 2024-06-14 MED ORDER — TRELEGY ELLIPTA 100-62.5-25 MCG/ACT IN AEPB: 1.0000 | INHALATION_SPRAY | Freq: Every day | RESPIRATORY_TRACT | 1 refills | Status: AC

## 2024-06-14 MED ORDER — ESCITALOPRAM OXALATE 20 MG PO TABS: 20.0000 mg | ORAL_TABLET | Freq: Every day | ORAL | 1 refills | Status: AC

## 2024-06-14 MED ORDER — ATORVASTATIN CALCIUM 20 MG PO TABS: 20.0000 mg | ORAL_TABLET | Freq: Every day | ORAL | 1 refills | Status: DC

## 2024-06-14 MED ORDER — ALPRAZOLAM 0.5 MG PO TABS: 0.5000 mg | ORAL_TABLET | Freq: Every day | ORAL | 0 refills | Status: AC | PRN

## 2024-06-14 NOTE — Progress Notes (Signed)
 Name: Cynthia Bean   MRN: [id number]    DOB: 02/10/1966   Date:06/14/2024       Progress Note  Subjective  Chief Complaint  Chief Complaint  Patient presents with   Medical Management of Chronic Issues   Medication Refill    Discuss referral to cardio: due to age and hx of plaque buildup and weather she needs to up dosage of statin   Discussed the use of AI scribe software for clinical note transcription with the patient, who gave verbal consent to proceed.  History of Present Illness Cynthia Bean is a 58 year old female with coronary artery disease who presents with concerns about tachycardia and heart-related symptoms. Her husband, Cynthia Bean, is in the lobby.  She experiences a resting heart rate of 110-118 bpm, as indicated by her watch, along with sharp chest pains that resolve spontaneously. Despite previous recommendations, she has not yet seen a cardiologist. Her family history includes heart issues, with both brothers having undergone ablations in the past   She has a history of coronary artery disease and also atherosclerosis of aorta and is currently taking atorvastatin  20 mg. Her last cholesterol check showed an LDL of 95 mg/dL and HDL of 67 mg/dL. She maintains a weight of approximately 142 lbs, having lost significant weight from 196 lbs in 2022 through a keto diet.  She experiences anxiety and takes a quarter of Xanax  daily, She also takes Lexapro  20 mg for anxiety. She feels anxious and wonders if she has panic attacks similar to her mother.  She has a history of pituitary adenoma with previously elevated prolactin levels, now stable. She experiences headaches and a persistent high-pitched sound in her head, which worsens at night. She has an upcoming appointment with an ENT specialist.  She reports back pain that fluctuates and worsens with prolonged sitting. She manages it by staying active. She reports a history of emphysema and uses Trelegy, though inconsistently due to  concerns about side effects.  She takes vitamin D  every other day, B12 supplements, and magnesium glycinate for relaxation. She has a history of low B12, now stable, and her vitamin D  levels are towards the high end of normal.    Patient Active Problem List   Diagnosis Date Noted   Perennial allergic rhinitis with seasonal variation 12/14/2023   Female pelvic congestion syndrome 12/14/2023   Coronary artery disease involving native coronary artery of native heart without angina pectoris 12/14/2023   Major depression in remission 12/30/2022   Centrilobular emphysema (HCC) 07/28/2022   Atherosclerosis of aorta 07/28/2022   History of colonic polyps    Polyp of descending colon    History of uterine fibroid 02/15/2019   Acquired trigger finger of right middle finger 04/27/2018   Trigger finger, right middle finger 04/19/2018   Dysfunction of both eustachian tubes 04/19/2018   Chronic left-sided lumbar radiculopathy 04/19/2018   ASCUS of cervix with negative high risk HPV 04/10/2017   Generalized anxiety disorder 09/20/2015   Allergic rhinitis 05/31/2015   Depression, major, recurrent, mild 05/31/2015   Dyslipidemia 05/31/2015   Irritable bowel syndrome with diarrhea 05/31/2015   Female climacteric state 05/31/2015   Pituitary adenoma (HCC) 05/31/2015   Restless legs syndrome 05/31/2015   B12 deficiency 05/31/2015   Epicondylitis, lateral (tennis elbow) 05/31/2015   Tobacco use 05/03/2009   Vitamin D  deficiency 05/03/2009    Past Surgical History:  Procedure Laterality Date   COLONOSCOPY WITH PROPOFOL  N/A 05/31/2019   Procedure: COLONOSCOPY WITH PROPOFOL ;  Surgeon: Jinny Carmine, MD;  Location: [facility] ENDOSCOPY;  Service: Endoscopy;  Laterality: N/A;   TONSILECTOMY, ADENOIDECTOMY, BILATERAL MYRINGOTOMY AND TUBES     TUBAL LIGATION     WISDOM TOOTH EXTRACTION     patient stated that she now has a dry socket    Family History  Problem Relation Age of Onset   Hyperlipidemia  Mother    Hypertension Mother    Thyroid disease Mother    Kidney disease Mother    Colon cancer Mother    Diabetes Father    Hypertension Father    CAD Father    Stroke Father    Breast cancer Maternal Grandmother    ADD / ADHD Son     Social History   Tobacco Use   Smoking status: Former    Current packs/day: 0.75    Average packs/day: 0.8 packs/day for 35.0 years (26.3 ttl pk-yrs)    Types: Cigarettes   Smokeless tobacco: Former    Quit date: 08/06/2016   Tobacco comments:    switched to e-cigarettes  Substance Use Topics   Alcohol use: Yes    Comment: occasional     Current Outpatient Medications:    ALPRAZolam  (XANAX ) 0.5 MG tablet, Take 1 tablet (0.5 mg total) by mouth daily as needed., Disp: 45 tablet, Rfl: 0   atorvastatin  (LIPITOR) 20 MG tablet, Take 1 tablet (20 mg total) by mouth daily., Disp: 90 tablet, Rfl: 1   Cholecalciferol (VITAMIN D ) 50 MCG (2000 UT) CAPS, Take 1 capsule (2,000 Units total) by mouth daily., Disp: 30 capsule, Rfl: 0   escitalopram  (LEXAPRO ) 20 MG tablet, Take 1 tablet (20 mg total) by mouth daily., Disp: 90 tablet, Rfl: 1   fluticasone  (FLONASE ) 50 MCG/ACT nasal spray, Place 2 sprays into both nostrils daily., Disp: 48 mL, Rfl: 1   Fluticasone -Umeclidin-Vilant (TRELEGY ELLIPTA ) 100-62.5-25 MCG/ACT AEPB, Inhale 1 puff into the lungs daily., Disp: 3 each, Rfl: 1   Probiotic Product (PROBIOTIC-10 PO), Take by mouth., Disp: , Rfl:   Allergies  Allergen Reactions   Biaxin  [Clarithromycin] Other (See Comments)   Bupropion Hcl Diarrhea   Darvocet [Propoxyphene N-Acetaminophen] Nausea Only    I personally reviewed active problem list, medication list, allergies, family history with the patient/caregiver today.   ROS  Ten systems reviewed and is negative except as mentioned in HPI    Objective Physical Exam MEASUREMENTS: Weight- 140. CONSTITUTIONAL: Patient appears well-developed and well-nourished. No distress. HEENT: Head atraumatic,  normocephalic, neck supple. CARDIOVASCULAR: Normal rate, regular rhythm and normal heart sounds. No murmur heard. No BLE edema. PULMONARY: Effort normal. Inspiratory wheezing present. Lungs clear after coughing. No respiratory distress. ABDOMINAL: There is no tenderness or distention. MUSCULOSKELETAL: Normal gait. Without gross motor or sensory deficit. PSYCHIATRIC: Patient has a normal mood and affect. Behavior is normal. Judgment and thought content normal.  Vitals:   06/14/24 1320  BP: 116/68  Pulse: 93  Resp: 18  Temp: 97.9 F (36.6 C)  SpO2: 96%  Weight: 144 lb 3.2 oz (65.4 kg)  Height: 5' 7 (1.702 m)    Body mass index is 22.58 kg/m.    PHQ2/9:    06/14/2024    1:18 PM 12/14/2023    1:04 PM 06/15/2023    1:18 PM 12/30/2022    1:26 PM 07/28/2022    1:15 PM  Depression screen PHQ 2/9  Decreased Interest 0 0 1 0 0  Down, Depressed, Hopeless 0 0 1 0 0  PHQ - 2 Score 0 0  2 0 0  Altered sleeping 0 0 0 0 0  Tired, decreased energy 0 0 1 0 0  Change in appetite 0 0 0 0 0  Feeling bad or failure about yourself  0 0 0 0 0  Trouble concentrating 0 0 1 2 0  Moving slowly or fidgety/restless 0 0 2 2 0  Suicidal thoughts 0 0 0 0 0  PHQ-9 Score 0 0  6  4  0   Difficult doing work/chores Not difficult at all Not difficult at all Somewhat difficult Not difficult at all      Data saved with a previous flowsheet row definition    phq 9 is negative  Fall Risk:    06/14/2024    1:22 PM 06/14/2024    1:18 PM 12/14/2023   12:58 PM 06/15/2023    1:16 PM 12/30/2022    1:17 PM  Fall Risk   Falls in the past year? 0 0 0 0 0  Number falls in past yr: 0 0 0 0   Injury with Fall? 0 0 0  0    Risk for fall due to :  No Fall Risks No Fall Risks  No Fall Risks  Follow up Falls evaluation completed Falls evaluation completed Falls prevention discussed;Education provided;Falls evaluation completed  Falls prevention discussed     Data saved with a previous flowsheet row definition       Assessment & Plan   Coronary artery disease and atherosclerosis of aorta Coronary artery disease with plaque formation. LDL cholesterol at 95 mg/dL, HDL at 67 mg/dL. No history of myocardial infarction. Discussed potential need for beta blocker if tachycardia persists after Zio patch monitoring. - Ordered comprehensive lipid panel with NMR. - Ordered high sensitivity C-reactive protein. - Referred to cardiologist for further evaluation.  Tachycardia and palpitations Reports resting heart rate of 178 bpm. Family history of cardiac issues. Discussed Zio patch for heart rhythm monitoring and potential beta blocker use if tachycardia persists. - Ordered Zio patch for heart rhythm monitoring. - Referred to cardiologist for further evaluation.  Centrilobular emphysema Reports dyspnea. Concerns about Trelegy side effects, particularly bone loss. Reassured about low systemic absorption of corticosteroids in Trelegy. - Encouraged daily use of Trelegy. - Advised rinsing mouth after Trelegy use to prevent thrush.  Benign pituitary adenoma Prolactin levels normal. No recent MRI. Reports headaches and ear issues, possibly unrelated to adenoma. - Ordered prolactin level. - Referred to ENT for ear evaluation.  Back pain with possible radiculopathy Intermittent back pain with possible radiculopathy. Previous CT scan showed no organ abnormalities. Pain may be referred from back. - Continue to monitor symptoms and maintain activity.  Generalized anxiety disorder Experiences anxiety, possibly related to menopause. Currently on Lexapro  and Xanax . Discussed potential overuse of Xanax  and alternative relaxation methods. - Continue Lexapro . - Prescribed Xanax  with caution, advised reducing use. - Recommended magnesium glycinate for relaxation.  History of vitamin D  and B12 deficiency Vitamin D  levels towards high end of normal. B12 levels previously low, currently taking supplements. - Ordered  vitamin D  level. - Ordered B12 and folic acid levels.  Allergic rhinitis Reports high-pitched sound in head, possibly related to allergic rhinitis. ENT appointment scheduled for further evaluation. - Continue with ENT appointment for ear evaluation.   Health maintenance  - Colonoscopy scheduled for February. - Lung cancer screening scheduled for February. - Pap smear due in April. - Flu shot received at [facility]. - Pneumonia shot received.

## 2024-06-20 ENCOUNTER — Ambulatory Visit: Payer: Self-pay | Admitting: Family Medicine

## 2024-06-20 LAB — HEMOGLOBIN A1C
Hgb A1c MFr Bld: 5.2 % (ref <5.7)
Mean Plasma Glucose: 103 mg/dL
eAG (mmol/L): 5.7 mmol/L

## 2024-06-20 LAB — LIPOPROTEIN FRACTIONATION, NMR W/ LIPID PNL
CHOL/HDL C: 2.5 calc (ref <5.0)
CHOLESTEROL, TOTAL: 161 mg/dL (ref <200)
HDL CHOLESTEROL: 64 mg/dL (ref >49)
HDL P: 38.4 umol/L (ref >32.8)
HDL Size: 9.4 nm (ref >9.0)
LARGE HDL P: 10.3 umol/L (ref >7.2)
LARGE VLDL P: 1.6 nmol/L (ref <3.7)
LDL CHOLESTEROL: 84 mg/dL (ref <100)
LDL P: 1389 nmol/L — ABNORMAL HIGH (ref <935)
LDL SIZE: 21 nm (ref >20.5)
NON HDL CHOLESTEROL: 98 mg/dL (ref <130)
SMALL LDL P: 383 nmol/L (ref <467)
TG/HDL C: 0.8 calc (ref <2.0)
TRIGLYCERIDES: 48 mg/dL (ref <150)
VLDL Size: 47.3 nm — ABNORMAL HIGH (ref <47.1)

## 2024-06-20 LAB — COMPREHENSIVE METABOLIC PANEL WITH GFR
AG Ratio: 2.1 (calc) (ref 1.0–2.5)
ALT: 14 U/L (ref 6–29)
AST: 15 U/L (ref 10–35)
Albumin: 4.4 g/dL (ref 3.6–5.1)
Alkaline phosphatase (APISO): 49 U/L (ref 37–153)
BUN: 13 mg/dL (ref 7–25)
CO2: 28 mmol/L (ref 20–32)
Calcium: 9.6 mg/dL (ref 8.6–10.4)
Chloride: 105 mmol/L (ref 98–110)
Creat: 0.97 mg/dL (ref 0.50–1.03)
Globulin: 2.1 g/dL (ref 1.9–3.7)
Glucose, Bld: 86 mg/dL (ref 65–99)
Potassium: 4.3 mmol/L (ref 3.5–5.3)
Sodium: 140 mmol/L (ref 135–146)
Total Bilirubin: 0.4 mg/dL (ref 0.2–1.2)
Total Protein: 6.5 g/dL (ref 6.1–8.1)
eGFR: 67 mL/min/1.73m2 (ref >=60)

## 2024-06-20 LAB — CBC WITH DIFFERENTIAL/PLATELET
Absolute Lymphocytes: 1560 {cells}/uL (ref 850–3900)
Absolute Monocytes: 509 {cells}/uL (ref 200–950)
Basophils Absolute: 48 {cells}/uL (ref 0–200)
Basophils Relative: 1 %
Eosinophils Absolute: 110 {cells}/uL (ref 15–500)
Eosinophils Relative: 2.3 %
HCT: 39.2 % (ref 35.9–46.0)
Hemoglobin: 13.4 g/dL (ref 11.7–15.5)
MCH: 33.3 pg — ABNORMAL HIGH (ref 27.0–33.0)
MCHC: 34.2 g/dL (ref 31.6–35.4)
MCV: 97.5 fL (ref 81.4–101.7)
MPV: 10.2 fL (ref 7.5–12.5)
Monocytes Relative: 10.6 %
Neutro Abs: 2573 {cells}/uL (ref 1500–7800)
Neutrophils Relative %: 53.6 %
Platelets: 237 Thousand/uL (ref 140–400)
RBC: 4.02 Million/uL (ref 3.80–5.10)
RDW: 11.7 % (ref 11.0–15.0)
Total Lymphocyte: 32.5 %
WBC: 4.8 Thousand/uL (ref 3.8–10.8)

## 2024-06-20 LAB — B12 AND FOLATE PANEL
Folate: 21.7 ng/mL
Vitamin B-12: 833 pg/mL (ref 200–1100)

## 2024-06-20 LAB — PROLACTIN: Prolactin: 5 ng/mL

## 2024-06-20 LAB — HIGH SENSITIVITY CRP: hs-CRP: 0.2 mg/L

## 2024-06-20 LAB — TSH+FREE T4: TSH W/REFLEX TO FT4: 1.65 m[IU]/L (ref 0.40–4.50)

## 2024-06-20 LAB — VITAMIN D 25 HYDROXY (VIT D DEFICIENCY, FRACTURES): Vit D, 25-Hydroxy: 67 ng/mL (ref 30–100)

## 2024-06-21 ENCOUNTER — Other Ambulatory Visit: Payer: Self-pay | Admitting: Family Medicine

## 2024-06-21 DIAGNOSIS — I700 Atherosclerosis of aorta: Secondary | ICD-10-CM

## 2024-06-21 DIAGNOSIS — R1012 Left upper quadrant pain: Secondary | ICD-10-CM

## 2024-06-21 MED ORDER — ATORVASTATIN CALCIUM 40 MG PO TABS: 40.0000 mg | ORAL_TABLET | Freq: Every day | ORAL | 1 refills | Status: AC

## 2024-06-24 ENCOUNTER — Ambulatory Visit

## 2024-06-28 ENCOUNTER — Other Ambulatory Visit: Payer: Self-pay | Admitting: Family Medicine

## 2024-06-28 ENCOUNTER — Encounter: Payer: Self-pay | Admitting: Family Medicine

## 2024-06-28 DIAGNOSIS — J302 Other seasonal allergic rhinitis: Secondary | ICD-10-CM

## 2024-06-28 MED ORDER — FLUTICASONE PROPIONATE 50 MCG/ACT NA SUSP: 2.0000 | Freq: Every day | NASAL | 1 refills | Status: AC

## 2024-07-01 NOTE — Telephone Encounter (Signed)
 Too soon for refill, duplicate request.  Requested Prescriptions  Pending Prescriptions Disp Refills   fluticasone  (FLONASE ) 50 MCG/ACT nasal spray [Pharmacy Med Name: FLUTICASONE  PROP 50MCG 120D NASAL INHL]  1    Sig: SPRAY 2 SPRAYS IN EACH NOSTRIL EVERY DAY     Ear, Nose, and Throat: Nasal Preparations - Corticosteroids Passed - 07/01/2024 11:07 AM      Passed - Valid encounter within last 12 months    Recent Outpatient Visits           2 weeks ago Pituitary adenoma [facility])   [facility] Glenard Mire, MD   6 months ago Major depression in remission   [facility] Glenard Mire, MD       Future Appointments             In 2 weeks Darron, Deatrice LABOR, MD [facility]

## 2024-07-19 ENCOUNTER — Encounter: Payer: Self-pay | Admitting: Cardiovascular Disease

## 2024-07-19 ENCOUNTER — Ambulatory Visit: Attending: Cardiovascular Disease | Admitting: Cardiovascular Disease

## 2024-07-19 VITALS — BP 103/60 | HR 80 | Ht 68.0 in | Wt 147.0 lb

## 2024-07-19 DIAGNOSIS — Z87898 Personal history of other specified conditions: Secondary | ICD-10-CM

## 2024-07-19 DIAGNOSIS — E785 Hyperlipidemia, unspecified: Secondary | ICD-10-CM | POA: Diagnosis not present

## 2024-07-19 DIAGNOSIS — R002 Palpitations: Secondary | ICD-10-CM

## 2024-07-19 DIAGNOSIS — R072 Precordial pain: Secondary | ICD-10-CM

## 2024-07-19 MED ORDER — METOPROLOL TARTRATE 50 MG PO TABS: ORAL_TABLET | ORAL | 0 refills | Status: AC

## 2024-07-19 MED ORDER — METOPROLOL TARTRATE 50 MG PO TABS: ORAL_TABLET | ORAL | 0 refills | Status: DC

## 2024-07-19 NOTE — Progress Notes (Unsigned)
 "    Cardiology Office Note   Date:  07/19/2024   ID:  Cynthia Bean, DOB [birth year], MRN [id number]  PCP:  Glenard Mire, MD  Cardiologist:   Deatrice Cage, MD   Chief Complaint  Patient presents with   Tachycardia    Pt chest pressure and pain sometimes, shortness of breathe.      History of Present Illness: Cynthia Bean is a 59 y.o. female who was referred by Dr. Glenard for evaluation of coronary artery disease and palpitations.  She has known history of coronary and aortic calcifications, tobacco use anxiety, hyperlipidemia and pituitary adenoma.  She quit smoking cigarettes in 2018 but continues to vape. She has family history of arrhythmia as 2 of her brothers had ablation.  There is family history of coronary artery disease on her father side. She used to be overweight but lost significant weight in 2022 through a keto diet. She had a recent episode of palpitations and tachycardia.  She had a 2-week ZIO monitor done which showed sinus rhythm with short runs of SVT the longest lasted 7 beats with no other significant arrhythmia.  She had CT calcium  score done in March 2025 which was slightly abnormal with a score of 33.7.  She had recent lipid profile done which showed an LDL of 84 and moderately elevated LDL P at 1389.  The dose of atorvastatin  was increased from 20 to 40 mg once daily after that.  She reports intermittent chest pain described as pressure and sharp sensation that usually happens with exertion.  The sharp component usually happens at rest.  She has shortness of breath that feels is related to her known history of tobacco use.  Past Medical History:  Diagnosis Date   Allergy    Anxiety    Depression    Hyperglycemia    Hyperlipidemia    Irritable bowel syndrome (IBS)    Menopausal state    Neck pain    Overweight    Palpitations    Pituitary tumor    RLS (restless legs syndrome)    Tobacco use disorder    Vitamin B12 deficiency    Vitamin D   deficiency     Past Surgical History:  Procedure Laterality Date   COLONOSCOPY WITH PROPOFOL  N/A 05/31/2019   Procedure: COLONOSCOPY WITH PROPOFOL ;  Surgeon: Jinny Carmine, MD;  Location: [facility] ENDOSCOPY;  Service: Endoscopy;  Laterality: N/A;   TONSILECTOMY, ADENOIDECTOMY, BILATERAL MYRINGOTOMY AND TUBES     TUBAL LIGATION     WISDOM TOOTH EXTRACTION     patient stated that she now has a dry socket     Current Outpatient Medications  Medication Sig Dispense Refill   ALPRAZolam  (XANAX ) 0.5 MG tablet Take 1 tablet (0.5 mg total) by mouth daily as needed. 45 tablet 0   atorvastatin  (LIPITOR) 40 MG tablet Take 1 tablet (40 mg total) by mouth daily. 90 tablet 1   b complex vitamins capsule Take 1 capsule by mouth daily.     Cholecalciferol (VITAMIN D ) 50 MCG (2000 UT) CAPS Take 1 capsule (2,000 Units total) by mouth daily. 30 capsule 0   escitalopram  (LEXAPRO ) 20 MG tablet Take 1 tablet (20 mg total) by mouth daily. 90 tablet 1   fluticasone  (FLONASE ) 50 MCG/ACT nasal spray Place 2 sprays into both nostrils daily. 48 mL 1   Fluticasone -Umeclidin-Vilant (TRELEGY ELLIPTA ) 100-62.5-25 MCG/ACT AEPB Inhale 1 puff into the lungs daily. 3 each 1   MAGNESIUM GLYCINATE PO Take 1 each by mouth  every evening.     Probiotic Product (PROBIOTIC-10 PO) Take by mouth.     No current facility-administered medications for this visit.    Allergies:   Biaxin  [clarithromycin], Bupropion hcl, and Darvocet [propoxyphene n-acetaminophen]    Social History:  The patient  reports that she has quit smoking. Her smoking use included cigarettes. She has a 26.3 pack-year smoking history. She quit smokeless tobacco use about 7 years ago. She reports current alcohol use. She reports that she does not use drugs.   Family History:  The patient's family history includes ADD / ADHD in her son; Breast cancer in her maternal grandmother; CAD in her father; Colon cancer in her mother; Diabetes in her father; Hyperlipidemia in  her mother; Hypertension in her father and mother; Kidney disease in her mother; Stroke in her father; Thyroid disease in her mother.    ROS:  Please see the history of present illness.   Otherwise, review of systems are positive for none.   All other systems are reviewed and negative.    PHYSICAL EXAM: VS:  BP 103/60 (BP Location: Left Arm, Patient Position: Sitting, Cuff Size: Normal)   Pulse 80 Comment: 81 oximeter  Ht 5' 8 (1.727 m)   Wt 147 lb (66.7 kg)   LMP 11/16/2017   SpO2 98%   BMI 22.35 kg/m  , BMI Body mass index is 22.35 kg/m. GEN: Well nourished, well developed, in no acute distress  HEENT: normal  Neck: no JVD, carotid bruits, or masses Cardiac: RRR; no murmurs, rubs, or gallops,no edema  Respiratory:  clear to auscultation bilaterally, normal work of breathing GI: soft, nontender, nondistended, + BS MS: no deformity or atrophy  Skin: warm and dry, no rash Neuro:  Strength and sensation are intact Psych: euthymic mood, full affect Distal pulses are palpable.  EKG:  EKG is ordered today. The ekg ordered today demonstrates : Normal sinus rhythm Low voltage QRS No previous ECGs available    Recent Labs: 06/16/2024: ALT 14; BUN 13; Creat 0.97; Hemoglobin 13.4; Platelets 237; Potassium 4.3; Sodium 140    Lipid Panel    Component Value Date/Time   CHOL 172 06/18/2023 0959   TRIG 52 06/18/2023 0959   HDL 67 06/18/2023 0959   CHOLHDL 2.6 06/18/2023 0959   CHOLHDL 3.8 07/30/2022 1152   VLDL 19 05/28/2016 1004   LDLCALC 95 06/18/2023 0959   LDLCALC 169 (H) 07/30/2022 1152      Wt Readings from Last 3 Encounters:  07/19/24 147 lb (66.7 kg)  06/14/24 144 lb 3.2 oz (65.4 kg)  12/14/23 147 lb 14.4 oz (67.1 kg)           07/19/2024    1:39 PM 07/12/2024    4:20 PM  PAD Screen  Previous PAD dx? No No   Previous surgical procedure? No No   Pain with walking? No No   Feet/toe relief with dangling? Yes No   Painful, non-healing ulcers? No No    Extremities discolored? No No      Manually entered by patient      ASSESSMENT AND PLAN:  1.  Coronary artery calcifications: CT calcium  score was mildly elevated in March.  She reports intermittent exertional chest tightness.  I recommend evaluation with cardiac CTA.  2.  Hyperlipidemia: I agree with recent increase of atorvastatin  to 40 mg once daily.  Recommend a target LDL of less than 70 but if cardiac CTA shows more significant disease, we will have to lower her  LDL further down.  3.  History of tachycardia and palpitations: Outpatient monitor showed only short runs of SVT that do not require treatment at the present time.  Continue to monitor symptoms.    Disposition:   FU with me as needed if cardiac CTA is abnormal.    Signed,  Deatrice Cage, MD  07/19/2024 1:55 PM    [facility] "

## 2024-07-19 NOTE — Patient Instructions (Signed)
 Medication Instructions:  No changes *If you need a refill on your cardiac medications before your next appointment, please call your pharmacy*  Lab Work: None ordered If you have labs (blood work) drawn today and your tests are completely normal, you will receive your results only by: MyChart Message (if you have MyChart) OR A paper copy in the mail If you have any lab test that is abnormal or we need to change your treatment, we will call you to review the results.   Follow-Up: At [facility], you and your health needs are our priority.  As part of our continuing mission to provide you with exceptional heart care, our providers are all part of one team.  This team includes your primary Cardiologist (physician) and Advanced Practice Providers or APPs (Physician Assistants and Nurse Practitioners) who all work together to provide you with the care you need, when you need it.  Your next appointment:   Follow up as needed based on the results  We recommend signing up for the patient portal called MyChart.  Sign up information is provided on this After Visit Summary.  MyChart is used to connect with patients for Virtual Visits (Telemedicine).  Patients are able to view lab/test results, encounter notes, upcoming appointments, etc.  Non-urgent messages can be sent to your provider as well.   To learn more about what you can do with MyChart, go to [email or web address].   Other Instructions   Your cardiac CT will be scheduled at one of the below locations:   [facility] [address] [place], [place] [address] [phone] (Severe contrast allergies only)  OR   [facility] [address] [place], [place] [address] [phone]  OR   [facility] [address] [place], [place] [address] [phone]  OR   [facility] and Vascular Tower [address]  [place], [place] [address]  OR   [facility] [address] [place], [place] [phone]  If scheduled at [facility], please arrive at the [facility] and Children's Entrance (Entrance C2) of [facility] 30 minutes prior to test start time. You can use the FREE valet parking offered at entrance C (encouraged to control the heart rate for the test)  Proceed to the [facility] Radiology Department (first floor) to check-in and test prep.  All radiology patients and guests should use entrance C2 at [facility], accessed from [facility], even though the hospital's physical address listed is [address]  If scheduled at the Heart and Vascular Tower at [address], please enter the parking lot using the [address] entrance and use the FREE valet service at the patient drop-off area. Enter the building and check-in with registration on the main floor.  If scheduled at [facility], please arrive to the Heart and Vascular Center 15 mins early for check-in and test prep.  There is spacious parking and easy access to the radiology department from the [facility] Heart and Vascular entrance. Please enter here and check-in with the desk attendant.   If scheduled at [facility], please arrive 30 minutes early for check-in and test prep.  Please follow these instructions carefully (unless otherwise directed):  An IV will be required for this test and Nitroglycerin will be given.    On the Night Before the Test: Be sure to Drink plenty of water. Do not consume any caffeinated/decaffeinated beverages or  chocolate 12 hours prior to your test. Do not take any antihistamines 12 hours prior to your test.   On the Day of the Test: Drink plenty of water until 1 hour prior to the test. Do not eat any food 1 hour prior to test. You may take your regular medications prior to the test.  Take metoprolol  (Lopressor ) two hours prior to test. If you take  Furosemide/Hydrochlorothiazide/Spironolactone/Chlorthalidone, please HOLD on the morning of the test. Patients who wear a continuous glucose monitor MUST remove the device prior to scanning. FEMALES- please wear underwire-free bra if available, avoid dresses & tight clothing  After the Test: Drink plenty of water. After receiving IV contrast, you may experience a mild flushed feeling. This is normal. On occasion, you may experience a mild rash up to 24 hours after the test. This is not dangerous. If this occurs, you can take Benadryl 25 mg, Zyrtec, Claritin , or Allegra and increase your fluid intake. (Patients taking Tikosyn should avoid Benadryl, and may take Zyrtec, Claritin , or Allegra) If you experience trouble breathing, this can be serious. If it is severe call 911 IMMEDIATELY. If it is mild, please call our office.  We will call to schedule your test 2-4 weeks out understanding that some insurance companies will need an authorization prior to the service being performed.   For more information and frequently asked questions, please visit our website : [email or web address]  For non-scheduling related questions, please contact the cardiac imaging nurse navigator should you have any questions/concerns: Cardiac Imaging Nurse Navigators Direct Office Dial: [phone]   For scheduling needs, including cancellations and rescheduling, please call Brittany, [phone].

## 2024-07-21 ENCOUNTER — Encounter: Payer: Self-pay | Admitting: Cardiovascular Disease

## 2024-07-28 ENCOUNTER — Encounter: Payer: Self-pay | Admitting: Acute Care

## 2024-08-01 DIAGNOSIS — R000 Tachycardia, unspecified: Secondary | ICD-10-CM | POA: Diagnosis not present

## 2024-08-08 ENCOUNTER — Ambulatory Visit

## 2024-08-11 ENCOUNTER — Other Ambulatory Visit

## 2024-08-17 ENCOUNTER — Encounter (HOSPITAL_COMMUNITY): Payer: Self-pay

## 2024-08-18 ENCOUNTER — Ambulatory Visit: Payer: Self-pay | Admitting: Cardiovascular Disease

## 2024-08-18 ENCOUNTER — Ambulatory Visit
Admission: RE | Admit: 2024-08-18 | Discharge: 2024-08-18 | Disposition: A | Source: Ambulatory Visit | Attending: Cardiovascular Disease | Admitting: Cardiovascular Disease

## 2024-08-18 DIAGNOSIS — R072 Precordial pain: Secondary | ICD-10-CM

## 2024-08-18 MED ORDER — IOHEXOL 350 MG/ML SOLN
100.0000 mL | Freq: Once | INTRAVENOUS | Status: AC | PRN
  Administered 2024-08-18: 100 mL via INTRAVENOUS

## 2024-08-18 MED ORDER — NITROGLYCERIN 0.4 MG SL SUBL
0.4000 mg | SUBLINGUAL_TABLET | Freq: Once | SUBLINGUAL | Status: AC
  Administered 2024-08-18: 0.4 mg via SUBLINGUAL
  Filled 2024-08-18: qty 25

## 2024-08-18 NOTE — Progress Notes (Signed)
 Patient tolerated procedure well. W/C to lobby.  Ambulate w/o difficulty. Denies light headedness or being dizzy. Encouraged to drink extra water today and reasoning explained. Verbalized understanding. All questions answered. ABC intact. No further needs. Discharge from procedure area w/o issues.

## 2024-08-23 ENCOUNTER — Encounter: Admission: RE | Payer: Self-pay | Source: Home / Self Care

## 2024-08-23 ENCOUNTER — Ambulatory Visit: Admission: RE | Admit: 2024-08-23 | Admitting: Gastroenterology

## 2024-08-30 ENCOUNTER — Ambulatory Visit

## 2024-12-13 ENCOUNTER — Ambulatory Visit: Admitting: Family Medicine
